# Patient Record
Sex: Male | Born: 1990
Health system: Southern US, Community
[De-identification: ages and names within clinical notes are randomized; demographics above are authoritative.]

## PROBLEM LIST (undated history)

## (undated) DIAGNOSIS — U071 COVID-19: Secondary | ICD-10-CM

## (undated) DIAGNOSIS — K37 Unspecified appendicitis: Secondary | ICD-10-CM

## (undated) HISTORY — PX: WISDOM TOOTH EXTRACTION: SHX21

## (undated) HISTORY — PX: TYMPANOSTOMY TUBE PLACEMENT: SHX32

## (undated) HISTORY — PX: APPENDECTOMY: SHX54

---

## 2012-04-30 ENCOUNTER — Emergency Department (HOSPITAL_COMMUNITY)
Admission: EM | Admit: 2012-04-30 | Discharge: 2012-04-30 | Disposition: A | Discharge status: Home / Self Care | Payer: BC Managed Care – PPO | Attending: Emergency Medicine | Admitting: Emergency Medicine

## 2012-04-30 ENCOUNTER — Encounter (HOSPITAL_COMMUNITY): Payer: Self-pay

## 2012-04-30 DIAGNOSIS — R197 Diarrhea, unspecified: Secondary | ICD-10-CM | POA: Insufficient documentation

## 2012-04-30 DIAGNOSIS — E860 Dehydration: Secondary | ICD-10-CM | POA: Insufficient documentation

## 2012-04-30 DIAGNOSIS — R5381 Other malaise: Secondary | ICD-10-CM | POA: Insufficient documentation

## 2012-04-30 DIAGNOSIS — R109 Unspecified abdominal pain: Secondary | ICD-10-CM | POA: Insufficient documentation

## 2012-04-30 DIAGNOSIS — R630 Anorexia: Secondary | ICD-10-CM | POA: Insufficient documentation

## 2012-04-30 LAB — CBC WITH DIFFERENTIAL/PLATELET
Basophils Absolute: 0 10*3/uL (ref 0.0–0.1)
Basophils Relative: 0 % (ref 0–1)
Eosinophils Absolute: 0.2 10*3/uL (ref 0.0–0.7)
MCH: 31.4 pg (ref 26.0–34.0)
MCHC: 36.4 g/dL — ABNORMAL HIGH (ref 30.0–36.0)
Neutrophils Relative %: 92 % — ABNORMAL HIGH (ref 43–77)
Platelets: 167 10*3/uL (ref 150–400)
RBC: 5.79 MIL/uL (ref 4.22–5.81)
RDW: 12.6 % (ref 11.5–15.5)

## 2012-04-30 LAB — COMPREHENSIVE METABOLIC PANEL
ALT: 30 U/L (ref 0–53)
Albumin: 4.6 g/dL (ref 3.5–5.2)
Alkaline Phosphatase: 129 U/L — ABNORMAL HIGH (ref 39–117)
Calcium: 9.6 mg/dL (ref 8.4–10.5)
Potassium: 4 mEq/L (ref 3.5–5.1)
Sodium: 136 mEq/L (ref 135–145)
Total Protein: 7.8 g/dL (ref 6.0–8.3)

## 2012-04-30 LAB — URINALYSIS, ROUTINE W REFLEX MICROSCOPIC
Bilirubin Urine: NEGATIVE
Hgb urine dipstick: NEGATIVE
Nitrite: NEGATIVE
Specific Gravity, Urine: 1.02 (ref 1.005–1.030)
pH: 5.5 (ref 5.0–8.0)

## 2012-04-30 MED ORDER — SODIUM CHLORIDE 0.9 % IV BOLUS (SEPSIS)
1000.0000 mL | Freq: Once | INTRAVENOUS | Status: AC
  Administered 2012-04-30: 1000 mL via INTRAVENOUS

## 2012-04-30 MED ORDER — ONDANSETRON HCL 4 MG/2ML IJ SOLN
4.0000 mg | Freq: Once | INTRAMUSCULAR | Status: AC
  Administered 2012-04-30: 4 mg via INTRAVENOUS
  Filled 2012-04-30: qty 2

## 2012-04-30 MED ORDER — ACETAMINOPHEN 325 MG PO TABS: 650.0000 mg | ORAL_TABLET | Freq: Once | ORAL | Status: DC

## 2012-04-30 MED ORDER — ONDANSETRON 8 MG PO TBDP: 8.0000 mg | ORAL_TABLET | Freq: Three times a day (TID) | ORAL | Status: DC | PRN

## 2012-04-30 MED ORDER — ACETAMINOPHEN 325 MG PO TABS
ORAL_TABLET | ORAL | Status: AC
  Administered 2012-04-30: 650 mg
  Filled 2012-04-30: qty 2

## 2012-04-30 MED ORDER — DIPHENOXYLATE-ATROPINE 2.5-0.025 MG PO TABS
1.0000 | ORAL_TABLET | Freq: Once | ORAL | Status: AC
  Administered 2012-04-30: 1 via ORAL
  Filled 2012-04-30: qty 1

## 2012-04-30 MED ORDER — PROMETHAZINE HCL 25 MG/ML IJ SOLN
12.5000 mg | Freq: Once | INTRAMUSCULAR | Status: AC
  Administered 2012-04-30: 12.5 mg via INTRAVENOUS
  Filled 2012-04-30: qty 1

## 2012-04-30 MED ORDER — ONDANSETRON 8 MG PO TBDP
8.0000 mg | ORAL_TABLET | Freq: Once | ORAL | Status: AC
  Administered 2012-04-30: 8 mg via ORAL
  Filled 2012-04-30: qty 1

## 2012-04-30 MED ORDER — PROMETHAZINE HCL 25 MG PO TABS: 25.0000 mg | ORAL_TABLET | Freq: Four times a day (QID) | ORAL | Status: DC | PRN

## 2012-04-30 NOTE — ED Provider Notes (Signed)
History     CSN: [id number]  Arrival date & time 04/30/12  3086   First MD Initiated Contact with Patient 04/30/12 [phone]      Chief Complaint  Patient presents with  . Emesis  . Diarrhea    (Consider location/radiation/quality/duration/timing/severity/associated sxs/prior treatment) HPI Anthony Robles is a 22 y.o. male who presents with complaint of abdominal pain, nausea, vomiting onset around 1 am. Pt states symptoms began with water diarrhea. States followed by vomiting. States vomited about 13 times. States no abdominal pain except for when vomiting. States he does not drink alcohol, no medical problems. No prior abdominal problems. No surgeries. 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 Not sure if any others are sick.   No past medical history on file.  Past Surgical History  Procedure Laterality Date  . Tympanostomy tube placement      No family history on file.  History  Substance Use Topics  . Smoking status: Never Smoker   . Smokeless tobacco: Not on file  . Alcohol Use: No      Review of Systems  Gastrointestinal: Positive for nausea, vomiting, abdominal pain and diarrhea.  Genitourinary: Negative for dysuria and flank pain.  Allergic/Immunologic: Negative for immunocompromised state.  Neurological: Positive for weakness.  All other systems reviewed and are negative.    Allergies  Review of patient's allergies indicates no known allergies.  Home Medications  No current outpatient prescriptions on file.  BP 127/59  Pulse 80  Temp(Src) 97.8 F (36.6 C) (Oral)  Resp 18  SpO2 99%  Physical Exam  Nursing note and vitals reviewed. Constitutional: He appears well-developed and well-nourished. No distress.  Eyes: Conjunctivae are normal.  Neck: Neck supple.  Cardiovascular: Normal rate, regular rhythm and normal heart sounds.   Pulmonary/Chest: Effort normal and breath sounds normal. No respiratory distress. He has no wheezes. He has no rales.  Abdominal:  Soft. Bowel sounds are normal. He exhibits no distension. There is no tenderness. There is no rebound.  Musculoskeletal: He exhibits no edema.  Skin: Skin is warm and dry.  Psychiatric: He has a normal mood and affect.    ED Course  Procedures (including critical care time)  Labs Reviewed  CBC WITH DIFFERENTIAL - Abnormal; Notable for the following:    WBC 15.2 (*)    Hemoglobin 18.2 (*)    MCHC 36.4 (*)    Neutrophils Relative 92 (*)    Neutro Abs 13.9 (*)    Lymphocytes Relative 3 (*)    Lymphs Abs 0.4 (*)    All other components within normal limits  COMPREHENSIVE METABOLIC PANEL - Abnormal; Notable for the following:    Glucose, Bld 126 (*)    BUN 26 (*)    Alkaline Phosphatase 129 (*)    All other components within normal limits  LIPASE, BLOOD - Abnormal; Notable for the following:    Lipase 61 (*)    All other components within normal limits  URINALYSIS, ROUTINE W REFLEX MICROSCOPIC   No results found.   1. Nausea vomiting and diarrhea   2. Dehydration       MDM  Pt with n/v/d onset few hrs ago. Here received IV fluids, zofran, feeling much better. Pt has no current abdominal pain or tenderness on exam. His lipase is slightly bumped to 61, discussed this with pt, instructed to return if pain worsening, could be early pancreatitis. Pt has no risk factors for pancreatitis, no family hx, does not drink alcohol, not on any medications. His  WBC elevated, most likely due to gastroenteritis. Abdomen non tender, no surgical abdomen. He ws given 3L of fluids for dehydration. He is drinking water with no nausea. Plan to d/c home with follow up.   Pt continued to have some nausea, just prior to d/c. Tried phenergan, which has helped. He again feeling better, drinking fluids. He did spike a fever up to 100, given tylenol for that. Abdomen reassessed. Non tender. Suspect viral gastroenteritis. Parents are by bedside. Will take pt home. Given careful precautions to return if  worsening.   Filed Vitals:   04/30/12 1158 04/30/12 1242 04/30/12 1246 04/30/12 1309  BP:  122/68  122/68  Pulse:  95  96  Temp: 100.4 F (38 C)  100.7 F (38.2 C)   TempSrc: Oral  Oral Oral  Resp:    18  SpO2:  100%  100%         Lottie Mussel, PA 04/30/12 1604  Lottie Mussel, PA 04/30/12 1604

## 2012-04-30 NOTE — ED Notes (Signed)
Per pt, felt gassy prior to bed last night.  States decreased appetite x 3-4 days.  N/V/D continuously since 1 am.  No fever noted at home.

## 2012-04-30 NOTE — ED Notes (Signed)
Voiced understanding of instructions given 

## 2012-04-30 NOTE — Discharge Instructions (Signed)
Make sure to drink plenty of fluids at home. Advance your diet slowly as tolerate. Zofran and/or phenergan for nausea as needed. Tylenol for fever every 4 hrs. Follow up with your doctor as needed. If develop abdominal pain, unable to keep any fluids down, symptoms worsening, return to ER.   Norovirus Infection Norovirus illness is caused by a viral infection. The term norovirus refers to a group of viruses. Any of those viruses can cause norovirus illness. This illness is often referred to by other names such as viral gastroenteritis, stomach flu, and food poisoning. Anyone can get a norovirus infection. People can have the illness multiple times during their lifetime. CAUSES  Norovirus is found in the stool or vomit of infected people. It is easily spread from person to person (contagious). People with norovirus are contagious from the moment they begin feeling ill. They may remain contagious for as long as 3 days to 2 weeks after recovery. People can become infected with the virus in several ways. This includes:  Eating food or drinking liquids that are contaminated with norovirus.  Touching surfaces or objects contaminated with norovirus, and then placing your hand in your mouth.  Having direct contact with a person who is infected and shows symptoms. This may occur while caring for someone with illness or while sharing foods or eating utensils with someone who is ill. SYMPTOMS  Symptoms usually begin 1 to 2 days after ingestion of the virus. Symptoms may include:  Nausea.  Vomiting.  Diarrhea.  Stomach cramps.  Low-grade fever.  Chills.  Headache.  Muscle aches.  Tiredness. Most people with norovirus illness get better within 1 to 2 days. Some people become dehydrated because they cannot drink enough liquids to replace those lost from vomiting and diarrhea. This is especially true for young children, the elderly, and others who are unable to care for themselves. DIAGNOSIS    Diagnosis is based on your symptoms and exam. Currently, only state public health laboratories have the ability to test for norovirus in stool or vomit. TREATMENT  No specific treatment exists for norovirus infections. No vaccine is available to prevent infections. Norovirus illness is usually brief in healthy people. If you are ill with vomiting and diarrhea, you should drink enough water and fluids to keep your urine clear or pale yellow. Dehydration is the most serious health effect that can result from this infection. By drinking oral rehydration solution (ORS), people can reduce their chance of becoming dehydrated. There are many commercially available pre-made and powdered ORS designed to safely rehydrate people. These may be recommended by your caregiver. Replace any new fluid losses from diarrhea or vomiting with ORS as follows:  If your child weighs 10 kg or less (22 lb or less), give 60 to 120 ml ( to  cup or 2 to 4 oz) of ORS for each diarrheal stool or vomiting episode.  If your child weighs more than 10 kg (more than 22 lb), give 120 to 240 ml ( to 1 cup or 4 to 8 oz) of ORS for each diarrheal stool or vomiting episode. HOME CARE INSTRUCTIONS   Follow all your caregiver's instructions.  Avoid sugar-free and alcoholic drinks while ill.  Only take over-the-counter or prescription medicines for pain, vomiting, diarrhea, or fever as directed by your caregiver. You can decrease your chances of coming in contact with norovirus or spreading it by following these steps:  Frequently wash your hands, especially after using the toilet, changing diapers, and before eating or  preparing food.  Carefully wash fruits and vegetables. Cook shellfish before eating them.  Do not prepare food for others while you are infected and for at least 3 days after recovering from illness.  Thoroughly clean and disinfect contaminated surfaces immediately after an episode of illness using a bleach-based  household cleaner.  Immediately remove and wash clothing or linens that may be contaminated with the virus.  Use the toilet to dispose of any vomit or stool. Make sure the surrounding area is kept clean.  Food that may have been contaminated by an ill person should be discarded. SEEK IMMEDIATE MEDICAL CARE IF:   You develop symptoms of dehydration that do not improve with fluid replacement. This may include:  Excessive sleepiness.  Lack of tears.  Dry mouth.  Dizziness when standing.  Weak pulse. Document Released: 05/22/2002 Document Revised: 05/24/2011 Document Reviewed: 06/23/2009 [facility] Patient Information 2013 [place], [place].  B.R.A.T. Diet Your doctor has recommended the B.R.A.T. diet for you or your child until the condition improves. This is often used to help control diarrhea and vomiting symptoms. If you or your child can tolerate clear liquids, you may have:  Bananas.   Rice.   Applesauce.   Toast (and other simple starches such as crackers, potatoes, noodles).  Be sure to avoid dairy products, meats, and fatty foods until symptoms are better. Fruit juices such as apple, grape, and prune juice can make diarrhea worse. Avoid these. Continue this diet for 2 days or as instructed by your caregiver. Document Released: 03/01/2005 Document Revised: 02/18/2011 Document Reviewed: 08/18/2006 [facility] Patient Information 2012 [place], [place].

## 2012-04-30 NOTE — ED Notes (Signed)
Pt states has not been feeling well x 2 days; generalized malaise and decreased appetite; N/V/D began this am around 1am; pt reports more than 10 episodes of vomiting and diarrhea; states abd is sore from vomiting and cramping; states pain and cramping come on with the vomiting and diarrhea; denies blood to vomit or emesis.

## 2012-05-03 NOTE — ED Provider Notes (Signed)
Medical screening examination/treatment/procedure(s) were performed by non-physician practitioner and as supervising physician I was immediately available for consultation/collaboration.  Kahealani Yankovich R. Blayke Cordrey, MD 05/03/12 1102 

## 2013-08-14 ENCOUNTER — Other Ambulatory Visit: Payer: Self-pay | Admitting: Family Medicine

## 2013-08-14 DIAGNOSIS — N6320 Unspecified lump in the left breast, unspecified quadrant: Secondary | ICD-10-CM

## 2013-08-20 ENCOUNTER — Other Ambulatory Visit: Payer: BC Managed Care – PPO

## 2013-08-22 ENCOUNTER — Other Ambulatory Visit: Payer: Self-pay | Admitting: Family Medicine

## 2013-08-22 ENCOUNTER — Ambulatory Visit
Admission: RE | Admit: 2013-08-22 | Discharge: 2013-08-22 | Disposition: A | Discharge status: Home / Self Care | Source: Ambulatory Visit | Attending: Family Medicine | Admitting: Family Medicine

## 2013-08-22 DIAGNOSIS — N6320 Unspecified lump in the left breast, unspecified quadrant: Secondary | ICD-10-CM

## 2013-08-22 DIAGNOSIS — N6310 Unspecified lump in the right breast, unspecified quadrant: Secondary | ICD-10-CM

## 2014-03-23 ENCOUNTER — Observation Stay (HOSPITAL_COMMUNITY): Admitting: Anesthesiology

## 2014-03-23 ENCOUNTER — Observation Stay (HOSPITAL_COMMUNITY)
Admission: EM | Admit: 2014-03-23 | Discharge: 2014-03-24 | Disposition: A | Discharge status: Home / Self Care | Attending: Surgery | Admitting: Surgery

## 2014-03-23 ENCOUNTER — Encounter (HOSPITAL_COMMUNITY)
Admission: EM | Disposition: A | Discharge status: Home / Self Care | Payer: Self-pay | Source: Home / Self Care | Attending: Emergency Medicine

## 2014-03-23 ENCOUNTER — Encounter (HOSPITAL_COMMUNITY): Payer: Self-pay | Admitting: *Deleted

## 2014-03-23 ENCOUNTER — Emergency Department (HOSPITAL_COMMUNITY)

## 2014-03-23 DIAGNOSIS — R1031 Right lower quadrant pain: Secondary | ICD-10-CM

## 2014-03-23 DIAGNOSIS — K37 Unspecified appendicitis: Secondary | ICD-10-CM | POA: Diagnosis present

## 2014-03-23 DIAGNOSIS — K3580 Unspecified acute appendicitis: Secondary | ICD-10-CM | POA: Diagnosis not present

## 2014-03-23 DIAGNOSIS — K3589 Other acute appendicitis without perforation or gangrene: Secondary | ICD-10-CM

## 2014-03-23 HISTORY — PX: LAPAROSCOPIC APPENDECTOMY: SHX408

## 2014-03-23 LAB — URINALYSIS, ROUTINE W REFLEX MICROSCOPIC
BILIRUBIN URINE: NEGATIVE
Glucose, UA: NEGATIVE mg/dL
Hgb urine dipstick: NEGATIVE
Ketones, ur: NEGATIVE mg/dL
LEUKOCYTES UA: NEGATIVE
Nitrite: NEGATIVE
Protein, ur: NEGATIVE mg/dL
SPECIFIC GRAVITY, URINE: 1.022 (ref 1.005–1.030)
UROBILINOGEN UA: 0.2 mg/dL (ref 0.0–1.0)
pH: 6.5 (ref 5.0–8.0)

## 2014-03-23 LAB — CBC WITH DIFFERENTIAL/PLATELET
BASOS ABS: 0.1 10*3/uL (ref 0.0–0.1)
BASOS PCT: 1 % (ref 0–1)
EOS ABS: 0.5 10*3/uL (ref 0.0–0.7)
Eosinophils Relative: 7 % — ABNORMAL HIGH (ref 0–5)
HEMATOCRIT: 44 % (ref 39.0–52.0)
Hemoglobin: 16 g/dL (ref 13.0–17.0)
Lymphocytes Relative: 19 % (ref 12–46)
Lymphs Abs: 1.3 10*3/uL (ref 0.7–4.0)
MCH: 30.3 pg (ref 26.0–34.0)
MCHC: 36.4 g/dL — AB (ref 30.0–36.0)
MCV: 83.3 fL (ref 78.0–100.0)
MONO ABS: 0.4 10*3/uL (ref 0.1–1.0)
MONOS PCT: 5 % (ref 3–12)
NEUTROS PCT: 68 % (ref 43–77)
Neutro Abs: 4.6 10*3/uL (ref 1.7–7.7)
PLATELETS: 191 10*3/uL (ref 150–400)
RBC: 5.28 MIL/uL (ref 4.22–5.81)
RDW: 12.4 % (ref 11.5–15.5)
WBC: 6.8 10*3/uL (ref 4.0–10.5)

## 2014-03-23 LAB — COMPREHENSIVE METABOLIC PANEL
ALBUMIN: 4.5 g/dL (ref 3.5–5.2)
ALT: 20 U/L (ref 0–53)
AST: 16 U/L (ref 0–37)
Alkaline Phosphatase: 80 U/L (ref 39–117)
Anion gap: 7 (ref 5–15)
BUN: 15 mg/dL (ref 6–23)
CO2: 25 mmol/L (ref 19–32)
CREATININE: 0.91 mg/dL (ref 0.50–1.35)
Calcium: 9.4 mg/dL (ref 8.4–10.5)
Chloride: 104 mEq/L (ref 96–112)
GFR calc non Af Amer: >90 mL/min (ref >90)
Glucose, Bld: 102 mg/dL — ABNORMAL HIGH (ref 70–99)
POTASSIUM: 3.8 mmol/L (ref 3.5–5.1)
SODIUM: 136 mmol/L (ref 135–145)
TOTAL PROTEIN: 6.9 g/dL (ref 6.0–8.3)
Total Bilirubin: 1.3 mg/dL — ABNORMAL HIGH (ref 0.3–1.2)

## 2014-03-23 SURGERY — APPENDECTOMY, LAPAROSCOPIC
Anesthesia: General

## 2014-03-23 MED ORDER — HYDROMORPHONE HCL 1 MG/ML IJ SOLN
0.5000 mg | INTRAMUSCULAR | Status: DC | PRN
  Administered 2014-03-23: 1 mg via INTRAVENOUS
  Filled 2014-03-23: qty 1

## 2014-03-23 MED ORDER — BUPIVACAINE-EPINEPHRINE (PF) 0.25% -1:200000 IJ SOLN
INTRAMUSCULAR | Status: AC
  Filled 2014-03-23: qty 30

## 2014-03-23 MED ORDER — ONDANSETRON HCL 4 MG/2ML IJ SOLN
INTRAMUSCULAR | Status: DC | PRN
  Administered 2014-03-23: 4 mg via INTRAVENOUS

## 2014-03-23 MED ORDER — DIPHENHYDRAMINE HCL 12.5 MG/5ML PO ELIX: 12.5000 mg | ORAL_SOLUTION | Freq: Four times a day (QID) | ORAL | Status: DC | PRN

## 2014-03-23 MED ORDER — 0.9 % SODIUM CHLORIDE (POUR BTL) OPTIME
TOPICAL | Status: DC | PRN
  Administered 2014-03-23: 1000 mL

## 2014-03-23 MED ORDER — VECURONIUM BROMIDE 10 MG IV SOLR
INTRAVENOUS | Status: DC | PRN
  Administered 2014-03-23: 2 mg via INTRAVENOUS
  Administered 2014-03-23: 3 mg via INTRAVENOUS

## 2014-03-23 MED ORDER — IOHEXOL 300 MG/ML  SOLN
25.0000 mL | Freq: Once | INTRAMUSCULAR | Status: AC | PRN
  Administered 2014-03-23: 25 mL via ORAL

## 2014-03-23 MED ORDER — HYDROCODONE-ACETAMINOPHEN 5-325 MG PO TABS
1.0000 | ORAL_TABLET | ORAL | Status: DC | PRN
  Administered 2014-03-23 – 2014-03-24 (×2): 2 via ORAL
  Filled 2014-03-23 (×2): qty 2

## 2014-03-23 MED ORDER — LIDOCAINE HCL (CARDIAC) 20 MG/ML IV SOLN
INTRAVENOUS | Status: DC | PRN
  Administered 2014-03-23: 50 mg via INTRAVENOUS

## 2014-03-23 MED ORDER — SUCCINYLCHOLINE CHLORIDE 20 MG/ML IJ SOLN
INTRAMUSCULAR | Status: DC | PRN
  Administered 2014-03-23: 100 mg via INTRAVENOUS

## 2014-03-23 MED ORDER — ONDANSETRON HCL 4 MG/2ML IJ SOLN: 4.0000 mg | Freq: Four times a day (QID) | INTRAMUSCULAR | Status: DC | PRN

## 2014-03-23 MED ORDER — SUCCINYLCHOLINE CHLORIDE 20 MG/ML IJ SOLN
INTRAMUSCULAR | Status: AC
  Filled 2014-03-23: qty 1

## 2014-03-23 MED ORDER — GLYCOPYRROLATE 0.2 MG/ML IJ SOLN
INTRAMUSCULAR | Status: AC
  Filled 2014-03-23: qty 2

## 2014-03-23 MED ORDER — MORPHINE SULFATE 4 MG/ML IJ SOLN
4.0000 mg | Freq: Once | INTRAMUSCULAR | Status: AC
  Administered 2014-03-23: 4 mg via INTRAVENOUS
  Filled 2014-03-23: qty 1

## 2014-03-23 MED ORDER — VECURONIUM BROMIDE 10 MG IV SOLR
INTRAVENOUS | Status: AC
  Filled 2014-03-23: qty 10

## 2014-03-23 MED ORDER — SODIUM CHLORIDE 0.9 % IV BOLUS (SEPSIS)
1000.0000 mL | Freq: Once | INTRAVENOUS | Status: AC
  Administered 2014-03-23: 1000 mL via INTRAVENOUS

## 2014-03-23 MED ORDER — FENTANYL CITRATE 0.05 MG/ML IJ SOLN
INTRAMUSCULAR | Status: DC | PRN
  Administered 2014-03-23: 150 ug via INTRAVENOUS
  Administered 2014-03-23: 100 ug via INTRAVENOUS

## 2014-03-23 MED ORDER — ONDANSETRON HCL 4 MG/2ML IJ SOLN
INTRAMUSCULAR | Status: AC
  Filled 2014-03-23: qty 2

## 2014-03-23 MED ORDER — DEXAMETHASONE SODIUM PHOSPHATE 4 MG/ML IJ SOLN
INTRAMUSCULAR | Status: DC | PRN
  Administered 2014-03-23: 4 mg via INTRAVENOUS

## 2014-03-23 MED ORDER — IOHEXOL 300 MG/ML  SOLN
100.0000 mL | Freq: Once | INTRAMUSCULAR | Status: AC | PRN
  Administered 2014-03-23: 100 mL via INTRAVENOUS

## 2014-03-23 MED ORDER — SODIUM CHLORIDE 0.9 % IJ SOLN
INTRAMUSCULAR | Status: AC
  Filled 2014-03-23: qty 10

## 2014-03-23 MED ORDER — BUPIVACAINE-EPINEPHRINE 0.25% -1:200000 IJ SOLN
INTRAMUSCULAR | Status: DC | PRN
  Administered 2014-03-23: 20 mL

## 2014-03-23 MED ORDER — GLYCOPYRROLATE 0.2 MG/ML IJ SOLN
INTRAMUSCULAR | Status: DC | PRN
  Administered 2014-03-23: 0.4 mg via INTRAVENOUS

## 2014-03-23 MED ORDER — ONDANSETRON HCL 4 MG/2ML IJ SOLN: 4.0000 mg | Freq: Once | INTRAMUSCULAR | Status: DC | PRN

## 2014-03-23 MED ORDER — DIPHENHYDRAMINE HCL 50 MG/ML IJ SOLN: 12.5000 mg | Freq: Four times a day (QID) | INTRAMUSCULAR | Status: DC | PRN

## 2014-03-23 MED ORDER — DEXTROSE 5 % IV SOLN
2.0000 g | Freq: Once | INTRAVENOUS | Status: AC
  Administered 2014-03-23: 2 g via INTRAVENOUS
  Filled 2014-03-23: qty 2

## 2014-03-23 MED ORDER — PROPOFOL 10 MG/ML IV BOLUS
INTRAVENOUS | Status: AC
  Filled 2014-03-23: qty 20

## 2014-03-23 MED ORDER — DEXTROSE 5 % IV SOLN
2.0000 g | INTRAVENOUS | Status: DC
  Filled 2014-03-23: qty 2

## 2014-03-23 MED ORDER — ONDANSETRON HCL 4 MG/2ML IJ SOLN
4.0000 mg | Freq: Once | INTRAMUSCULAR | Status: AC
  Administered 2014-03-23: 4 mg via INTRAVENOUS
  Filled 2014-03-23: qty 2

## 2014-03-23 MED ORDER — NEOSTIGMINE METHYLSULFATE 10 MG/10ML IV SOLN
INTRAVENOUS | Status: DC | PRN
  Administered 2014-03-23: 2 mg via INTRAVENOUS

## 2014-03-23 MED ORDER — LIDOCAINE HCL (CARDIAC) 20 MG/ML IV SOLN
INTRAVENOUS | Status: AC
  Filled 2014-03-23: qty 5

## 2014-03-23 MED ORDER — PROPOFOL 10 MG/ML IV BOLUS
INTRAVENOUS | Status: DC | PRN
  Administered 2014-03-23: 200 mg via INTRAVENOUS

## 2014-03-23 MED ORDER — POTASSIUM CHLORIDE IN NACL 20-0.9 MEQ/L-% IV SOLN
INTRAVENOUS | Status: DC
Start: 2014-03-23 — End: 2014-03-24
  Administered 2014-03-23 – 2014-03-24 (×2): via INTRAVENOUS
  Filled 2014-03-23 (×4): qty 1000

## 2014-03-23 MED ORDER — LACTATED RINGERS IV SOLN
INTRAVENOUS | Status: DC | PRN
  Administered 2014-03-23 (×2): via INTRAVENOUS

## 2014-03-23 MED ORDER — HYDROMORPHONE HCL 1 MG/ML IJ SOLN
1.0000 mg | Freq: Once | INTRAMUSCULAR | Status: AC
  Administered 2014-03-23: 1 mg via INTRAVENOUS
  Filled 2014-03-23: qty 1

## 2014-03-23 MED ORDER — HYDROMORPHONE HCL 1 MG/ML IJ SOLN: 0.2500 mg | INTRAMUSCULAR | Status: DC | PRN

## 2014-03-23 MED ORDER — SODIUM CHLORIDE 0.9 % IR SOLN
Status: DC | PRN
  Administered 2014-03-23: 1000 mL

## 2014-03-23 MED ORDER — NEOSTIGMINE METHYLSULFATE 10 MG/10ML IV SOLN
INTRAVENOUS | Status: AC
  Filled 2014-03-23: qty 1

## 2014-03-23 MED ORDER — FENTANYL CITRATE 0.05 MG/ML IJ SOLN
INTRAMUSCULAR | Status: AC
  Filled 2014-03-23: qty 5

## 2014-03-23 SURGICAL SUPPLY — 42 items
APPLIER CLIP ROT 10 11.4 M/L (STAPLE)
BENZOIN TINCTURE PRP APPL 2/3 (GAUZE/BANDAGES/DRESSINGS) ×3 IMPLANT
BLADE SURG ROTATE 9660 (MISCELLANEOUS) IMPLANT
CANISTER SUCTION 2500CC (MISCELLANEOUS) ×3 IMPLANT
CHLORAPREP W/TINT 26ML (MISCELLANEOUS) ×3 IMPLANT
CLIP APPLIE ROT 10 11.4 M/L (STAPLE) IMPLANT
CLOSURE STERI-STRIP 1/2X4 (GAUZE/BANDAGES/DRESSINGS) ×1
CLSR STERI-STRIP ANTIMIC 1/2X4 (GAUZE/BANDAGES/DRESSINGS) ×2 IMPLANT
COVER SURGICAL LIGHT HANDLE (MISCELLANEOUS) ×3 IMPLANT
CUTTER FLEX LINEAR 45M (STAPLE) ×3 IMPLANT
DRAPE LAPAROSCOPIC ABDOMINAL (DRAPES) ×3 IMPLANT
DRAPE UTILITY XL STRL (DRAPES) ×6 IMPLANT
DRSG TEGADERM 2-3/8X2-3/4 SM (GAUZE/BANDAGES/DRESSINGS) ×6 IMPLANT
DRSG TEGADERM 4X4.75 (GAUZE/BANDAGES/DRESSINGS) ×3 IMPLANT
ELECT REM PT RETURN 9FT ADLT (ELECTROSURGICAL) ×3
ELECTRODE REM PT RTRN 9FT ADLT (ELECTROSURGICAL) ×1 IMPLANT
ENDOLOOP SUT PDS II  0 18 (SUTURE)
ENDOLOOP SUT PDS II 0 18 (SUTURE) IMPLANT
FILTER SMOKE EVAC LAPAROSHD (FILTER) ×3 IMPLANT
GAUZE SPONGE 2X2 8PLY STRL LF (GAUZE/BANDAGES/DRESSINGS) ×1 IMPLANT
GLOVE BIO SURGEON STRL SZ7 (GLOVE) ×3 IMPLANT
GLOVE BIOGEL PI IND STRL 7.5 (GLOVE) ×1 IMPLANT
GLOVE BIOGEL PI INDICATOR 7.5 (GLOVE) ×2
GOWN STRL REUS W/ TWL LRG LVL3 (GOWN DISPOSABLE) ×3 IMPLANT
GOWN STRL REUS W/TWL LRG LVL3 (GOWN DISPOSABLE) ×6
KIT BASIN OR (CUSTOM PROCEDURE TRAY) ×3 IMPLANT
KIT ROOM TURNOVER OR (KITS) ×3 IMPLANT
NS IRRIG 1000ML POUR BTL (IV SOLUTION) ×3 IMPLANT
PAD ARMBOARD 7.5X6 YLW CONV (MISCELLANEOUS) ×6 IMPLANT
POUCH SPECIMEN RETRIEVAL 10MM (ENDOMECHANICALS) ×3 IMPLANT
RELOAD STAPLE TA45 3.5 REG BLU (ENDOMECHANICALS) ×3 IMPLANT
SCALPEL HARMONIC ACE (MISCELLANEOUS) ×3 IMPLANT
SET IRRIG TUBING LAPAROSCOPIC (IRRIGATION / IRRIGATOR) ×3 IMPLANT
SPECIMEN JAR SMALL (MISCELLANEOUS) ×3 IMPLANT
SPONGE GAUZE 2X2 STER 10/PKG (GAUZE/BANDAGES/DRESSINGS) ×2
SUT MNCRL AB 4-0 PS2 18 (SUTURE) ×3 IMPLANT
TOWEL OR 17X24 6PK STRL BLUE (TOWEL DISPOSABLE) ×3 IMPLANT
TOWEL OR 17X26 10 PK STRL BLUE (TOWEL DISPOSABLE) ×3 IMPLANT
TRAY LAPAROSCOPIC (CUSTOM PROCEDURE TRAY) ×3 IMPLANT
TROCAR XCEL BLADELESS 5X75MML (TROCAR) ×6 IMPLANT
TROCAR XCEL BLUNT TIP 100MML (ENDOMECHANICALS) ×3 IMPLANT
TUBING INSUFFLATION (TUBING) ×3 IMPLANT

## 2014-03-23 NOTE — Anesthesia Procedure Notes (Signed)
Procedure Name: Intubation Date/Time: 03/23/2014 5:17 PM Performed by: Alanda AmassFRIEDMAN, Lashon Beringer A Pre-anesthesia Checklist: Patient identified, Timeout performed, Emergency Drugs available, Suction available and Patient being monitored Patient Re-evaluated:Patient Re-evaluated prior to inductionOxygen Delivery Method: Circle system utilized Preoxygenation: Pre-oxygenation with 100% oxygen Intubation Type: IV induction Laryngoscope Size: Mac and 3 Grade View: Grade I Tube type: Oral Tube size: 7.5 mm Number of attempts: 1 Airway Equipment and Method: Stylet Placement Confirmation: ETT inserted through vocal cords under direct vision,  breath sounds checked- equal and bilateral and positive ETCO2 Secured at: 21 cm Tube secured with: Tape Dental Injury: Teeth and Oropharynx as per pre-operative assessment

## 2014-03-23 NOTE — ED Notes (Signed)
Pt knows that urine is needed 

## 2014-03-23 NOTE — Anesthesia Postprocedure Evaluation (Signed)
  Anesthesia Post-op Note  Patient: Anthony Robles  Procedure(s) Performed: Procedure(s): APPENDECTOMY LAPAROSCOPIC (N/A)  Patient Location: PACU  Anesthesia Type:General  Level of Consciousness: awake, alert , oriented and patient cooperative  Airway and Oxygen Therapy: Patient Spontanous Breathing  Post-op Pain: mild  Post-op Assessment: Post-op Vital signs reviewed, Patient's Cardiovascular Status Stable, Respiratory Function Stable, Patent Airway, No signs of Nausea or vomiting and Pain level controlled  Post-op Vital Signs: stable  Last Vitals:  Filed Vitals:   03/23/14 1615  BP: 111/74  Pulse: 85  Temp:   Resp: 14    Complications: No apparent anesthesia complications

## 2014-03-23 NOTE — ED Notes (Signed)
PA at bedside.

## 2014-03-23 NOTE — ED Notes (Signed)
Pt reports onset this am of severe RLQ pain and nausea/diarrhea.

## 2014-03-23 NOTE — H&P (Signed)
[facility] Admission Note  Anthony Robles 1990/05/15  [id number].    Requesting MD: Dr. Colin Rhein Chief Complaint/Reason for Consult: Acute appendicitis  HPI:  24 y/o white male with no significant PMH presenting to the ED for abdominal pain.  Patient states sudden onset of 8/10 RLQ and right lateral abdominal pain associated with nausea and diarrhea this morning around 9:30am. He denies any vomiting. No melena or hematochezia.  No prior abdominal surgeries or abdominal problems.  +feverish/chills.  No radiating pain, no precipitating or alleviating factors.  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  He is also an avid exerciser.    ROS: All systems reviewed and otherwise negative except for as above  History reviewed. No pertinent family history.  History reviewed. No pertinent past medical history.  Past Surgical History  Procedure Laterality Date  . Tympanostomy tube placement      [age] y/o   . Wisdom tooth extraction      24 y/o    Social History:  reports that he has never smoked. He does not have any smokeless tobacco history on file. He reports that he drinks alcohol. He reports that he does not use illicit drugs.  Allergies: No Known Allergies   (Not in a hospital admission)  Blood pressure 134/79, pulse 80, temperature 98.1 F (36.7 C), temperature source Oral, resp. rate 13, SpO2 98 %. Physical Exam: General: pleasant, WD/WN white male who is laying in bed in NAD HEENT: head is normocephalic, atraumatic.  Sclera are noninjected.  PERRL.  Ears and nose without any masses or lesions.  Mouth is pink and moist Heart: regular, rate, and rhythm.  No obvious murmurs, gallops, or rubs noted.  Palpable pedal pulses bilaterally Lungs: CTAB, no wheezes, rhonchi, or rales noted.  Respiratory effort nonlabored Abd: soft, tender in RLQ, +pain at McBurney's point, +rovsing sign, ND, +BS, no masses, hernias, or organomegaly, no scars MS: all 4 extremities are  symmetrical with no cyanosis, clubbing, or edema. Skin: warm and dry with no masses, lesions, or rashes Psych: A&Ox3 with an appropriate affect.   Results for orders placed or performed during the hospital encounter of 03/23/14 (from the past 48 hour(s))  CBC with Differential     Status: Abnormal   Collection Time: 03/23/14 12:36 PM  Result Value Ref Range   WBC 6.8 4.0 - 10.5 K/uL   RBC 5.28 4.22 - 5.81 MIL/uL   Hemoglobin 16.0 13.0 - 17.0 g/dL   HCT 44.0 39.0 - 52.0 %   MCV 83.3 78.0 - 100.0 fL   MCH 30.3 26.0 - 34.0 pg   MCHC 36.4 (H) 30.0 - 36.0 g/dL   RDW 12.4 11.5 - 15.5 %   Platelets 191 150 - 400 K/uL   Neutrophils Relative % 68 43 - 77 %   Neutro Abs 4.6 1.7 - 7.7 K/uL   Lymphocytes Relative 19 12 - 46 %   Lymphs Abs 1.3 0.7 - 4.0 K/uL   Monocytes Relative 5 3 - 12 %   Monocytes Absolute 0.4 0.1 - 1.0 K/uL   Eosinophils Relative 7 (H) 0 - 5 %   Eosinophils Absolute 0.5 0.0 - 0.7 K/uL   Basophils Relative 1 0 - 1 %   Basophils Absolute 0.1 0.0 - 0.1 K/uL  Comprehensive metabolic panel     Status: Abnormal   Collection Time: 03/23/14 12:36 PM  Result Value Ref Range   Sodium 136 135 - 145 mmol/L    Comment: Please note  change in reference range.   Potassium 3.8 3.5 - 5.1 mmol/L    Comment: Please note change in reference range.   Chloride 104 96 - 112 mEq/L   CO2 25 19 - 32 mmol/L   Glucose, Bld 102 (H) 70 - 99 mg/dL   BUN 15 6 - 23 mg/dL   Creatinine, Ser 0.91 0.50 - 1.35 mg/dL   Calcium 9.4 8.4 - 10.5 mg/dL   Total Protein 6.9 6.0 - 8.3 g/dL   Albumin 4.5 3.5 - 5.2 g/dL   AST 16 0 - 37 U/L   ALT 20 0 - 53 U/L   Alkaline Phosphatase 80 39 - 117 U/L   Total Bilirubin 1.3 (H) 0.3 - 1.2 mg/dL   GFR calc non Af Amer >90 >90 mL/min   GFR calc Af Amer >90 >90 mL/min    Comment: (NOTE) The eGFR has been calculated using the CKD EPI equation. This calculation has not been validated in all clinical situations. eGFR's persistently <90 mL/min signify possible Chronic  Kidney Disease.    Anion gap 7 5 - 15  Urinalysis, Routine w reflex microscopic     Status: Abnormal   Collection Time: 03/23/14  1:44 PM  Result Value Ref Range   Color, Urine YELLOW YELLOW   APPearance CLOUDY (A) CLEAR   Specific Gravity, Urine 1.022 1.005 - 1.030   pH 6.5 5.0 - 8.0   Glucose, UA NEGATIVE NEGATIVE mg/dL   Hgb urine dipstick NEGATIVE NEGATIVE   Bilirubin Urine NEGATIVE NEGATIVE   Ketones, ur NEGATIVE NEGATIVE mg/dL   Protein, ur NEGATIVE NEGATIVE mg/dL   Urobilinogen, UA 0.2 0.0 - 1.0 mg/dL   Nitrite NEGATIVE NEGATIVE   Leukocytes, UA NEGATIVE NEGATIVE    Comment: MICROSCOPIC NOT DONE ON URINES WITH NEGATIVE PROTEIN, BLOOD, LEUKOCYTES, NITRITE, OR GLUCOSE <1000 mg/dL.   Ct Abdomen Pelvis W Contrast  03/23/2014   CLINICAL DATA:  6 hour history of the right lower quadrant pain. Nausea and diarrhea  EXAM: CT ABDOMEN AND PELVIS WITH CONTRAST  TECHNIQUE: Multidetector CT imaging of the abdomen and pelvis was performed using the standard protocol following bolus administration of intravenous contrast. Oral contrast was also administered.  CONTRAST:  152mL OMNIPAQUE IOHEXOL 300 MG/ML  SOLN  COMPARISON:  None.  FINDINGS: Lung bases are clear.  There are no focal liver lesions. Gallbladder wall is not thickened. There is no appreciable biliary duct dilatation.  Spleen, pancreas, and adrenals appear normal. Kidneys bilaterally show no appreciable mass or hydronephrosis on either side. There is no intrarenal or ureteral calculus on either side.  In the pelvis, the urinary bladder is midline with normal wall thickness. There is no pelvic mass or fluid collection.  There are several appendicoliths within the appendix. The appendix is mildly distended with subtle surrounding mesenteric thickening consistent with early acute appendicitis. No abscess seen in this region.  There is no bowel obstruction. No free air or portal venous air. The colon is largely decompressed. There are scattered  colonic diverticula without diverticulitis.  There is no ascites, adenopathy, or abscess in the abdomen or pelvis. Aorta appears unremarkable. There are no blastic or lytic bone lesions.  IMPRESSION: Evidence of acute appendiceal inflammation.  No abscess. No bowel obstruction. Scattered colonic diverticula without diverticulitis. No renal or ureteral calculus. No hydronephrosis.  Critical Value/emergent results were called by telephone at the time of interpretation on 03/23/2014 at 3:36 pm to [facility], PA, who verbally acknowledged these results.   Electronically Signed  By: Lowella Grip M.D.   On: 03/23/2014 15:36      Assessment/Plan Acute appendictitis  Plan: 1.  Admit to [facility], urgent OR for lap appy 2.  NPO, bowel rest, IVF, pain control, antiemetics, antibiotics (Cefoxitin) 3.  SCD's  4.  Discussed risks and benefits of the procedure including bleeding, infection, injury to intra-abdominal structures, conversion to open, intra-abdominal abscess, and stump leak/need for drain.  Discussed peri-operative course and post-op instructions.  He and his significant other agree to proceed with surgery.  Dr. Georgette Dover to see and evaluate.    Coralie Keens, [facility] 03/23/2014, 4:15 PM Pager: [phone]

## 2014-03-23 NOTE — ED Notes (Signed)
General surgery at bedside. 

## 2014-03-23 NOTE — Anesthesia Preprocedure Evaluation (Addendum)
Anesthesia Evaluation  Patient identified by MRN, date of birth, ID band Patient awake    Reviewed: Allergy & Precautions, NPO status , Patient's Chart, lab work & pertinent test results  Airway Mallampati: I  TM Distance: >3 FB Neck ROM: Full    Dental  (+) Teeth Intact, Dental Advisory Given   Pulmonary          Cardiovascular     Neuro/Psych    GI/Hepatic appendicitis   Endo/Other    Renal/GU      Musculoskeletal   Abdominal   Peds  Hematology   Anesthesia Other Findings   Reproductive/Obstetrics                            Anesthesia Physical Anesthesia Plan  ASA: I  Anesthesia Plan: General   Post-op Pain Management:    Induction: Intravenous  Airway Management Planned: Oral ETT  Additional Equipment:   Intra-op Plan:   Post-operative Plan: Extubation in OR  Informed Consent: I have reviewed the patients History and Physical, chart, labs and discussed the procedure including the risks, benefits and alternatives for the proposed anesthesia with the patient or authorized representative who has indicated his/her understanding and acceptance.   Dental advisory given  Plan Discussed with: CRNA, Anesthesiologist and Surgeon  Anesthesia Plan Comments:        Anesthesia Quick Evaluation

## 2014-03-23 NOTE — ED Provider Notes (Signed)
CSN: [id number]     Arrival date & time 03/23/14  1215 History   First MD Initiated Contact with Patient 03/23/14 1221     Chief Complaint  Patient presents with  . Abdominal Pain  . Nausea     (Consider location/radiation/quality/duration/timing/severity/associated sxs/prior Treatment) Patient is a 24 y.o. male presenting with abdominal pain. The history is provided by the patient and medical records.  Abdominal Pain Associated symptoms: diarrhea     This is a 24 year old male with no significant past medical history presenting to the ED for abdominal pain. Patient states sudden onset of right lower quadrant and right lateral abdominal pain associated with nausea and diarrhea this morning. He denies any vomiting. No melena or hematochezia. No recent travel or dietary changes. No abnormal food intake.   No prior abdominal surgeries. No fever or chills. No urinary symptoms. Vital signs stable on arrival.  History reviewed. No pertinent past medical history. Past Surgical History  Procedure Laterality Date  . Tympanostomy tube placement     History reviewed. No pertinent family history. History  Substance Use Topics  . Smoking status: Never Smoker   . Smokeless tobacco: Not on file  . Alcohol Use: Yes     Comment: occ    Review of Systems  Gastrointestinal: Positive for abdominal pain and diarrhea.  All other systems reviewed and are negative.     Allergies  Review of patient's allergies indicates no known allergies.  Home Medications   Prior to Admission medications   Medication Sig Start Date End Date Taking? Authorizing Provider  ondansetron (ZOFRAN ODT) 8 MG disintegrating tablet Take 1 tablet (8 mg total) by mouth every 8 (eight) hours as needed for nausea. 04/30/12   Tatyana A Kirichenko, PA-C  promethazine (PHENERGAN) 25 MG tablet Take 1 tablet (25 mg total) by mouth every 6 (six) hours as needed for nausea. 04/30/12   Tatyana A Kirichenko, PA-C   BP 155/85 mmHg   Pulse 72  Temp(Src) 98.1 F (36.7 C) (Oral)  Resp 18  SpO2 98%   Physical Exam  Constitutional: He is oriented to person, place, and time. He appears well-developed and well-nourished.  HENT:  Head: Normocephalic and atraumatic.  Mouth/Throat: Oropharynx is clear and moist.  Eyes: Conjunctivae and EOM are normal. Pupils are equal, round, and reactive to light.  Neck: Normal range of motion.  Cardiovascular: Normal rate, regular rhythm and normal heart sounds.   Pulmonary/Chest: Effort normal and breath sounds normal. No respiratory distress. He has no wheezes.  Abdominal: Soft. Bowel sounds are normal. There is tenderness in the right lower quadrant. There is no tenderness at McBurney's point.  Abdomen soft, non-distended, tenderness in right lower and lateral abdomen; negative McBurney's point tenderness, negative heel tap, negative Rovsing's sign  Musculoskeletal: Normal range of motion.  Neurological: He is alert and oriented to person, place, and time.  Skin: Skin is warm and dry.  Psychiatric: He has a normal mood and affect.  Nursing note and vitals reviewed.   ED Course  Procedures (including critical care time) Labs Review Labs Reviewed  CBC WITH DIFFERENTIAL - Abnormal; Notable for the following:    MCHC 36.4 (*)    Eosinophils Relative 7 (*)    All other components within normal limits  COMPREHENSIVE METABOLIC PANEL - Abnormal; Notable for the following:    Glucose, Bld 102 (*)    Total Bilirubin 1.3 (*)    All other components within normal limits  URINALYSIS, ROUTINE W REFLEX MICROSCOPIC -  Abnormal; Notable for the following:    APPearance CLOUDY (*)    All other components within normal limits    Imaging Review Ct Abdomen Pelvis W Contrast  03/23/2014   CLINICAL DATA:  6 hour history of the right lower quadrant pain. Nausea and diarrhea  EXAM: CT ABDOMEN AND PELVIS WITH CONTRAST  TECHNIQUE: Multidetector CT imaging of the abdomen and pelvis was performed using  the standard protocol following bolus administration of intravenous contrast. Oral contrast was also administered.  CONTRAST:  100mL OMNIPAQUE IOHEXOL 300 MG/ML  SOLN  COMPARISON:  None.  FINDINGS: Lung bases are clear.  There are no focal liver lesions. Gallbladder wall is not thickened. There is no appreciable biliary duct dilatation.  Spleen, pancreas, and adrenals appear normal. Kidneys bilaterally show no appreciable mass or hydronephrosis on either side. There is no intrarenal or ureteral calculus on either side.  In the pelvis, the urinary bladder is midline with normal wall thickness. There is no pelvic mass or fluid collection.  There are several appendicoliths within the appendix. The appendix is mildly distended with subtle surrounding mesenteric thickening consistent with early acute appendicitis. No abscess seen in this region.  There is no bowel obstruction. No free air or portal venous air. The colon is largely decompressed. There are scattered colonic diverticula without diverticulitis.  There is no ascites, adenopathy, or abscess in the abdomen or pelvis. Aorta appears unremarkable. There are no blastic or lytic bone lesions.  IMPRESSION: Evidence of acute appendiceal inflammation.  No abscess. No bowel obstruction. Scattered colonic diverticula without diverticulitis. No renal or ureteral calculus. No hydronephrosis.  Critical Value/emergent results were called by telephone at the time of interpretation on 03/23/2014 at 3:36 pm to [facility] Rapheal Masso, PA, who verbally acknowledged these results.   Electronically Signed   By: Bretta BangWilliam  Woodruff M.D.   On: 03/23/2014 15:36     EKG Interpretation None      MDM   Final diagnoses:  RLQ abdominal pain  Other acute appendicitis   24 year old male with sudden onset of right lower quadrant pain and diarrhea this morning.   No melena or hematochezia. No fever or chills. On exam, patient afebrile and nontoxic in appearance. He does have noted tenderness  in his right lower quadrant and right lateral abdomen.  Negative McBurney's point tenderness, negative heel-tap, negative Rovsing sign.   Not classic presentation for appendicitis, but on differential. Will obtain basic labs, patient given pain medication, antiemetics, and IV fluids. Will monitor for improvement.  1:56 PM After fluids and initial meds no improvement.  Repeat exam with increased tenderness.  Will obtain CT for further evaluation.  CT revealing acute appendicitis.  Case discussed with general surgery, PA Dort, will evaluate in ED and admit for surgery.  Garlon HatchetLisa M Aislynn Cifelli, PA-C 03/23/14 1553  Mirian MoMatthew Gentry, MD 03/26/14 [phone]

## 2014-03-23 NOTE — Transfer of Care (Signed)
Immediate Anesthesia Transfer of Care Note  Patient: Anthony GulaDavid Robles  Procedure(s) Performed: Procedure(s): APPENDECTOMY LAPAROSCOPIC (N/A)  Patient Location: PACU  Anesthesia Type:General  Level of Consciousness: awake  Airway & Oxygen Therapy: Patient Spontanous Breathing  Post-op Assessment: Report given to PACU RN and Post -op Vital signs reviewed and stable  Post vital signs: Reviewed  Complications: No apparent anesthesia complications

## 2014-03-23 NOTE — Op Note (Signed)
Appendectomy, Lap, Procedure Note  Indications: The patient presented with a history of right-sided abdominal pain. A CT scan revealed findings consistent with acute appendicitis.  Pre-operative Diagnosis: Acute appendicitis without mention of peritonitis  Post-operative Diagnosis: Same  Surgeon: Mariangela Heldt K.   Assistants: none  Anesthesia: General endotracheal anesthesia  ASA Class: 1 (Emergent)   Procedure Details  The patient was seen again in the Holding Room. The risks, benefits, complications, treatment options, and expected outcomes were discussed with the patient and/or family. The possibilities of reaction to medication, perforation of viscus, bleeding, recurrent infection, finding a normal appendix, the need for additional procedures, failure to diagnose a condition, and creating a complication requiring transfusion or operation were discussed. There was concurrence with the proposed plan and informed consent was obtained. The site of surgery was properly noted. The patient was taken to Operating Room, identified as Anthony Robles and the procedure verified as Appendectomy. A Time Out was held and the above information confirmed.  The patient was placed in the supine position and general anesthesia was induced.  The abdomen was prepped and draped in a sterile fashion. A one centimeter supraumbilical incision was made.  Dissection was carried down to the fascia bluntly.  The fascia was incised vertically.  We entered the peritoneal cavity bluntly.  A pursestring suture was passed around the incision with a 0 Vicryl.  The Hasson cannula was introduced into the abdomen and the tails of the suture were used to hold the Hasson in place.   The pneumoperitoneum was then established maintaining a maximum pressure of 15 mmHg.  Additional 5 mm cannulas then placed in the left lower quadrant of the abdomen and the right upper quadrant under direct visualization. A careful evaluation of the  entire abdomen was carried out. The patient was placed in Trendelenburg and left lateral decubitus position.  The scope was moved to the right upper quadrant port site. The cecum was mobilized medially.  The appendix was thickened and inflamed, but there was no sign of perforation.  The appendix was carefully dissected. The appendix was was skeletonized with the harmonic scalpel.   The appendix was divided at its base using an endo-GIA stapler. Minimal appendiceal stump was left in place. There was no evidence of bleeding, leakage, or complication after division of the appendix. Irrigation was also performed and irrigate suctioned from the abdomen as well.  The umbilical port site was closed with the purse string suture. There was no residual palpable fascial defect.  The trocar site skin wounds were closed with 4-0 Monocryl.  Instrument, sponge, and needle counts were correct at the conclusion of the case.   Findings: The appendix was found to be inflamed. There were not signs of necrosis.  There was not perforation. There was not abscess formation.  Estimated Blood Loss:  Minimal         Drains: none         Specimens: Appendix         Complications:  None; patient tolerated the procedure well.         Disposition: PACU - hemodynamically stable.         Condition: stable   Anthony ArmsMatthew K. Corliss Skainssuei, MD, [facility]  General/ Trauma Surgery  03/23/2014 5:47 PM

## 2014-03-23 NOTE — ED Notes (Addendum)
CT notified that pt finished contrast ?

## 2014-03-23 NOTE — ED Notes (Signed)
Pt from home for eval of right sided flank pain that started at 0900 today while in bed, tenderness noted to right upper and lower quadrants of flank. Pt reports nausea and diarrhea x3 times today, denies any vomiting or other urinary symptoms. NAD. axox 4.

## 2014-03-24 MED ORDER — HYDROCODONE-ACETAMINOPHEN 5-325 MG PO TABS: 1.0000 | ORAL_TABLET | Freq: Four times a day (QID) | ORAL | Status: DC | PRN

## 2014-03-24 NOTE — Discharge Summary (Signed)
[facility] Discharge Summary   Patient ID: Anthony Robles MRN: [id number] DOB/AGE: March 13, 1991 23 y.o.  Admit date: 03/23/2014 Discharge date: 03/24/2014  Admitting Diagnosis: Acute appendicitis  Discharge Diagnosis Patient Active Problem List   Diagnosis Date Noted  . Acute appendicitis 03/23/2014  . Appendicitis 03/23/2014    Consultants None  Imaging: Ct Abdomen Pelvis W Contrast  03/23/2014   CLINICAL DATA:  6 hour history of the right lower quadrant pain. Nausea and diarrhea  EXAM: CT ABDOMEN AND PELVIS WITH CONTRAST  TECHNIQUE: Multidetector CT imaging of the abdomen and pelvis was performed using the standard protocol following bolus administration of intravenous contrast. Oral contrast was also administered.  CONTRAST:  OMNIPAQUE IOHEXOL 300 MG/ML  SOLN  COMPARISON:  None.  FINDINGS: Lung bases are clear.  There are no focal liver lesions. Gallbladder wall is not thickened. There is no appreciable biliary duct dilatation.  Spleen, pancreas, and adrenals appear normal. Kidneys bilaterally show no appreciable mass or hydronephrosis on either side. There is no intrarenal or ureteral calculus on either side.  In the pelvis, the urinary bladder is midline with normal wall thickness. There is no pelvic mass or fluid collection.  There are several appendicoliths within the appendix. The appendix is mildly distended with subtle surrounding mesenteric thickening consistent with early acute appendicitis. No abscess seen in this region.  There is no bowel obstruction. No free air or portal venous air. The colon is largely decompressed. There are scattered colonic diverticula without diverticulitis.  There is no ascites, adenopathy, or abscess in the abdomen or pelvis. Aorta appears unremarkable. There are no blastic or lytic bone lesions.  IMPRESSION: Evidence of acute appendiceal inflammation.  No abscess. No bowel obstruction. Scattered colonic diverticula without  diverticulitis. No renal or ureteral calculus. No hydronephrosis.  Critical Value/emergent results were called by telephone at the time of interpretation on 03/23/2014 at 3:36 pm to [facility], PA, who verbally acknowledged these results.   Electronically Signed   By: Bretta Bang M.D.   On: 03/23/2014 15:36    Procedures Dr. Corliss Skains (03/23/13) - Laparoscopic Appendectomy  Hospital Course:  24 y/o white male with no significant PMH presenting to the ED for abdominal pain. Patient states sudden onset of 8/10 RLQ and right lateral abdominal pain associated with nausea and diarrhea this morning around 9:30am. He denies any vomiting. No melena or hematochezia. No prior abdominal surgeries or abdominal problems. +feverish/chills. No radiating pain, no precipitating or alleviating factors. 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 He is also an avid exerciser.   Workup showed Acute appendicitis without leukocytosis.  Patient was admitted and underwent procedure listed above.  Tolerated procedure well and was transferred to the floor.  Diet was advanced as tolerated.  On POD #1, the patient was voiding well, tolerating diet, ambulating well, pain well controlled, vital signs stable, incisions c/d/i and felt stable for discharge home.  Patient will follow up in our office in 2 weeks and knows to call with questions or concerns.   Physical Exam: General:  Alert, NAD, pleasant, comfortable Abd:  Soft, ND, mild tenderness, incisions C/D/I    Medication List    STOP taking these medications        ondansetron 8 MG disintegrating tablet  Commonly known as:  ZOFRAN ODT     promethazine 25 MG tablet  Commonly known as:  PHENERGAN      TAKE these medications        HYDROcodone-acetaminophen 5-325  MG per tablet  Commonly known as:  NORCO/VICODIN  Take 1-2 tablets by mouth every 6 (six) hours as needed for moderate pain or severe pain.     multivitamin with minerals tablet   Take 1 tablet by mouth daily.             Follow-up Information    [facility] [place] [facility].   Why:  For post-operation check. Your appointment is at 1:30pm, please arrive at least 30 min before your appointment to complete your check in paperwork.  If you are unable to arrive 30 min prior to your appointment time we may have to cancel or reschedule you   Contact information:   [address]   [place] [address] [phone]       Signed: Candiss NorseMegan Dort, PA-C [facility] [phone]  03/24/2014, 7:23 AM

## 2014-03-24 NOTE — Progress Notes (Signed)
UR completed 

## 2014-03-24 NOTE — Discharge Instructions (Signed)
Your appointment on 04/16/14 is at 1:30pm, please arrive at least 30 min before your appointment to complete your check in paperwork.  If you are unable to arrive 30 min prior to your appointment time we may have to cancel or reschedule you.  LAPAROSCOPIC SURGERY: POST OP INSTRUCTIONS  1. DIET: Follow a light bland diet the first 24 hours after arrival home, such as soup, liquids, crackers, etc. Be sure to include lots of fluids daily. Avoid fast food or heavy meals as your are more likely to get nauseated. Eat a low fat the next few days after surgery.  2. Take your usually prescribed home medications unless otherwise directed. 3. PAIN CONTROL:  1. Pain is best controlled by a usual combination of three different methods TOGETHER:  1. Ice/Heat 2. Over the counter pain medication 3. Prescription pain medication 2. Most patients will experience some swelling and bruising around the incisions. Ice packs or heating pads (30-60 minutes up to 6 times a day) will help. Use ice for the first few days to help decrease swelling and bruising, then switch to heat to help relax tight/sore spots and speed recovery. Some people prefer to use ice alone, heat alone, alternating between ice & heat. Experiment to what works for you. Swelling and bruising can take several weeks to resolve.  3. It is helpful to take an over-the-counter pain medication regularly for the first few weeks. Choose one of the following that works best for you:  1. Naproxen (Aleve, etc) Two 220mg  tabs twice a day 2. Ibuprofen (Advil, etc) Three 200mg  tabs four times a day (every meal & bedtime) 3. Acetaminophen (Tylenol, etc) 500-650mg  four times a day (every meal & bedtime) 4. A prescription for pain medication (such as oxycodone, hydrocodone, etc) should be given to you upon discharge. Take your pain medication as prescribed.  1. If you are having problems/concerns with the prescription medicine (does not control pain, nausea, vomiting, rash,  itching, etc), please call us [phone] to see if we need to switch you to a different pain medicine that will work better for you and/or control your side effect better. 2. If you need a refill on your pain medication, please contact your pharmacy. They will contact our office to request authorization. Prescriptions will not be filled after 5 pm or on week-ends. 4. Avoid getting constipated. Between the surgery and the pain medications, it is common to experience some constipation. Increasing fluid intake and taking a fiber supplement (such as Metamucil, Citrucel, FiberCon, MiraLax, etc) 1-2 times a day regularly will usually help prevent this problem from occurring. A mild laxative (prune juice, Milk of Magnesia, MiraLax, etc) should be taken according to package directions if there are no bowel movements after 48 hours.  5. Watch out for diarrhea. If you have many loose bowel movements, simplify your diet to bland foods & liquids for a few days. Stop any stool softeners and decrease your fiber supplement. Switching to mild anti-diarrheal medications (Kayopectate, Pepto Bismol) can help. If this worsens or does not improve, please call us. 6. Wash / shower every day. You may shower over the dressings as they are waterproof. Continue to shower over incision(s) after the dressing is off. 7. Remove your waterproof bandages 5 days after surgery. You may leave the incision open to air. You may replace a dressing/Band-Aid to cover the incision for comfort if you wish.  8. ACTIVITIES as tolerated:  1. You may resume regular (light) daily activities beginning the next  day--such as daily self-care, walking, climbing stairs--gradually increasing activities as tolerated. If you can walk 30 minutes without difficulty, it is safe to try more intense activity such as jogging, treadmill, bicycling, low-impact aerobics, swimming, etc. 2. Save the most intensive and strenuous activity for last such as sit-ups, heavy  lifting, contact sports, etc Refrain from any heavy lifting or straining until you are off narcotics for pain control.  3. DO NOT PUSH THROUGH PAIN. Let pain be your guide: If it hurts to do something, don't do it. Pain is your body warning you to avoid that activity for another week until the pain goes down. 4. You may drive when you are no longer taking prescription pain medication, you can comfortably wear a seatbelt, and you can safely maneuver your car and apply brakes. 5. You may have sexual intercourse when it is comfortable.  9. FOLLOW UP in our office  1. Please call [facility] at [phone] to set up an appointment to see your surgeon in the office for a follow-up appointment approximately 2-3 weeks after your surgery. 2. Make sure that you call for this appointment the day you arrive home to insure a convenient appointment time.      10. IF YOU HAVE DISABILITY OR FAMILY LEAVE FORMS, BRING THEM TO THE               OFFICE FOR PROCESSING.   WHEN TO CALL US [phone]:  1. Poor pain control 2. Reactions / problems with new medications (rash/itching, nausea, etc)  3. Fever over 101.5 F (38.5 C) 4. Inability to urinate 5. Nausea and/or vomiting 6. Worsening swelling or bruising 7. Continued bleeding from incision. 8. Increased pain, redness, or drainage from the incision  The clinic staff is available to answer your questions during regular business hours (8:30am-5pm). Please dont hesitate to call and ask to speak to one of our nurses for clinical concerns.  If you have a medical emergency, go to the nearest emergency room or call 911.  A surgeon from [facility] is always on call at the [facility]   [facility] [place]  [address], [address], [place], [place] [address] ?  MAIN: [phone] ? TOLL FREE: [phone] ?  FAX [phone]  [email or web address]

## 2014-03-25 ENCOUNTER — Encounter (HOSPITAL_COMMUNITY): Payer: Self-pay | Admitting: Surgery

## 2014-07-13 ENCOUNTER — Emergency Department (HOSPITAL_COMMUNITY)
Admission: EM | Admit: 2014-07-13 | Discharge: 2014-07-13 | Disposition: A | Discharge status: Home / Self Care | Attending: Emergency Medicine | Admitting: Emergency Medicine

## 2014-07-13 ENCOUNTER — Encounter (HOSPITAL_COMMUNITY): Payer: Self-pay | Admitting: Emergency Medicine

## 2014-07-13 DIAGNOSIS — Z79899 Other long term (current) drug therapy: Secondary | ICD-10-CM | POA: Insufficient documentation

## 2014-07-13 DIAGNOSIS — R112 Nausea with vomiting, unspecified: Secondary | ICD-10-CM | POA: Diagnosis present

## 2014-07-13 DIAGNOSIS — A084 Viral intestinal infection, unspecified: Secondary | ICD-10-CM | POA: Insufficient documentation

## 2014-07-13 LAB — COMPREHENSIVE METABOLIC PANEL
ALBUMIN: 4.6 g/dL (ref 3.5–5.2)
ALK PHOS: 93 U/L (ref 39–117)
ALT: 18 U/L (ref 0–53)
ANION GAP: 12 (ref 5–15)
AST: 14 U/L (ref 0–37)
BILIRUBIN TOTAL: 0.8 mg/dL (ref 0.3–1.2)
BUN: 13 mg/dL (ref 6–23)
CHLORIDE: 105 mmol/L (ref 96–112)
CO2: 23 mmol/L (ref 19–32)
Calcium: 9.4 mg/dL (ref 8.4–10.5)
Creatinine, Ser: 0.9 mg/dL (ref 0.50–1.35)
GFR calc Af Amer: >90 mL/min (ref >90)
GFR calc non Af Amer: >90 mL/min (ref >90)
Glucose, Bld: 113 mg/dL — ABNORMAL HIGH (ref 70–99)
POTASSIUM: 4.1 mmol/L (ref 3.5–5.1)
SODIUM: 140 mmol/L (ref 135–145)
TOTAL PROTEIN: 7.2 g/dL (ref 6.0–8.3)

## 2014-07-13 LAB — LIPASE, BLOOD: Lipase: 34 U/L (ref 11–59)

## 2014-07-13 MED ORDER — SODIUM CHLORIDE 0.9 % IV BOLUS (SEPSIS)
2000.0000 mL | Freq: Once | INTRAVENOUS | Status: AC
  Administered 2014-07-13: 2000 mL via INTRAVENOUS

## 2014-07-13 MED ORDER — ONDANSETRON HCL 4 MG/2ML IJ SOLN
4.0000 mg | Freq: Once | INTRAMUSCULAR | Status: DC
  Administered 2014-07-13: 4 mg via INTRAVENOUS

## 2014-07-13 MED ORDER — ONDANSETRON HCL 4 MG/2ML IJ SOLN
INTRAMUSCULAR | Status: AC
  Administered 2014-07-13: 4 mg via INTRAVENOUS
  Filled 2014-07-13: qty 2

## 2014-07-13 MED ORDER — ONDANSETRON HCL 4 MG PO TABS: 4.0000 mg | ORAL_TABLET | Freq: Four times a day (QID) | ORAL | Status: DC

## 2014-07-13 NOTE — ED Provider Notes (Signed)
The patient was received in sign out from Dr. Radford PaxBeaton. Briefly, this is a 24 year old male, with no pertinent past medical history, presenting with diarrhea, nausea, vomiting, starting today. Favored to represent gastroenteritis. Examination is benign.  Plan for now is to fluid resuscitate, check basic labs, administer antiemetics intravenously, by mouth challenge. The patient is able to take by mouth, we'll discharge.  Patient is taking by mouth well, without emesis. His exam remains stable, abdomen remains benign. I have given strict return precautions. I'm discharging the patient with prescription for Zofran. He expresses understanding.  I have discussed case and care has been guided by my attending physician, Silverio LayYao.  Anthony BostonStirling Adams Hinch, MD 07/13/14 1656  Richardean Canalavid H Yao, MD 07/14/14 [phone]

## 2014-07-13 NOTE — ED Notes (Signed)
Pt c/o N/V/D onset today. Pt has had 5 episodes of vomiting and diarrhea. Pt unable to tolerate PO food or fluids.

## 2014-07-13 NOTE — ED Provider Notes (Signed)
CSN: [id number]     Arrival date & time 07/13/14  1154 History   First MD Initiated Contact with Patient 07/13/14 1353     Chief Complaint  Patient presents with  . Nausea  . Emesis  . Diarrhea      HPI Pt c/o N/V/D onset today. Pt has had 5 episodes of vomiting and diarrhea. Pt unable to tolerate PO food or fluids. History reviewed. No pertinent past medical history. Past Surgical History  Procedure Laterality Date  . Tympanostomy tube placement      [age] y/o   . Wisdom tooth extraction      24 y/o  . Laparoscopic appendectomy N/A 03/23/2014    Procedure: APPENDECTOMY LAPAROSCOPIC;  Surgeon: Manus RuddMatthew Tsuei, MD;  Location: [facility] OR;  Service: General;  Laterality: N/A;   No family history on file. History  Substance Use Topics  . Smoking status: Never Smoker   . Smokeless tobacco: Not on file  . Alcohol Use: Yes     Comment: occ    Review of Systems  All other systems reviewed and are negative.     Allergies  Review of patient's allergies indicates no known allergies.  Home Medications   Prior to Admission medications   Medication Sig Start Date End Date Taking? Authorizing Provider  HYDROcodone-acetaminophen (NORCO/VICODIN) 5-325 MG per tablet Take 1-2 tablets by mouth every 6 (six) hours as needed for moderate pain or severe pain. 03/24/14   Megan N Dort, PA-C  Multiple Vitamins-Minerals (MULTIVITAMIN WITH MINERALS) tablet Take 1 tablet by mouth daily.    Historical Provider, MD  ondansetron (ZOFRAN) 4 MG tablet Take 1 tablet (4 mg total) by mouth every 6 (six) hours. 07/13/14   Nelva Nayobert Naika Noto, MD   BP 113/69 mmHg  Pulse 88  Temp(Src) 97.5 F (36.4 C) (Oral)  Resp 16  Ht 6\' 1"  (1.854 m)  Wt 190 lb (86.183 kg)  BMI 25.07 kg/m2  SpO2 100% Physical Exam  Constitutional: He is oriented to person, place, and time. He appears well-developed and well-nourished. No distress.  HENT:  Head: Normocephalic and atraumatic.  Eyes: Pupils are equal, round, and reactive to  light.  Neck: Normal range of motion.  Cardiovascular: Normal rate and intact distal pulses.   Pulmonary/Chest: No respiratory distress.  Abdominal: Normal appearance. He exhibits no distension. There is no tenderness. There is no rebound and no guarding.  Musculoskeletal: Normal range of motion.  Neurological: He is alert and oriented to person, place, and time. No cranial nerve deficit.  Skin: Skin is warm and dry. No rash noted.  Psychiatric: He has a normal mood and affect. His behavior is normal.  Nursing note and vitals reviewed.   ED Course  Procedures (including critical care time)  Medications  sodium chloride 0.9 % bolus 2,000 mL (0 mLs Intravenous Stopped 07/13/14 1648)    Labs Review Labs Reviewed  COMPREHENSIVE METABOLIC PANEL - Abnormal; Notable for the following:    Glucose, Bld 113 (*)    All other components within normal limits  LIPASE, BLOOD  CBC WITH DIFFERENTIAL/PLATELET    Imaging Review No results found.    MDM   Final diagnoses:  Viral gastroenteritis        Nelva Nayobert Anshika Pethtel, MD 07/14/14 1100

## 2014-07-13 NOTE — ED Notes (Signed)
The pt reports that he has no pain  Iv fluid still infusing.  Drinking gingerale with no problem

## 2014-07-13 NOTE — ED Notes (Signed)
Pt feels better hes ready to go home

## 2014-07-13 NOTE — Discharge Instructions (Signed)
Food Choices to Help Relieve Diarrhea °When you have diarrhea, the foods you eat and your eating habits are very important. Choosing the right foods and drinks can help relieve diarrhea. Also, because diarrhea can last up to 7 days, you need to replace lost fluids and electrolytes (such as sodium, potassium, and chloride) in order to help prevent dehydration.  °WHAT GENERAL GUIDELINES DO I NEED TO FOLLOW? °· Slowly drink 1 cup (8 oz) of fluid for each episode of diarrhea. If you are getting enough fluid, your urine will be clear or pale yellow. °· Eat starchy foods. Some good choices include white rice, white toast, pasta, low-fiber cereal, baked potatoes (without the skin), saltine crackers, and bagels. °· Avoid large servings of any cooked vegetables. °· Limit fruit to two servings per day. A serving is ½ cup or 1 small piece. °· Choose foods with less than 2 g of fiber per serving. °· Limit fats to less than 8 tsp (38 g) per day. °· Avoid fried foods. °· Eat foods that have probiotics in them. Probiotics can be found in certain dairy products. °· Avoid foods and beverages that may increase the speed at which food moves through the stomach and intestines (gastrointestinal tract). Things to avoid include: °¨ High-fiber foods, such as dried fruit, raw fruits and vegetables, nuts, seeds, and whole grain foods. °¨ Spicy foods and high-fat foods. °¨ Foods and beverages sweetened with high-fructose corn syrup, honey, or sugar alcohols such as xylitol, sorbitol, and mannitol. °WHAT FOODS ARE RECOMMENDED? °Grains °White rice. White, French, or pita breads (fresh or toasted), including plain rolls, buns, or bagels. White pasta. Saltine, soda, or graham crackers. Pretzels. Low-fiber cereal. Cooked cereals made with water (such as cornmeal, farina, or cream cereals). Plain muffins. Matzo. Melba toast. Zwieback.  °Vegetables °Potatoes (without the skin). Strained tomato and vegetable juices. Most well-cooked and canned  vegetables without seeds. Tender lettuce. °Fruits °Cooked or canned applesauce, apricots, cherries, fruit cocktail, grapefruit, peaches, pears, or plums. Fresh bananas, apples without skin, cherries, grapes, cantaloupe, grapefruit, peaches, oranges, or plums.  °Meat and Other Protein Products °Baked or boiled chicken. Eggs. Tofu. Fish. Seafood. Smooth peanut butter. Ground or well-cooked tender beef, ham, veal, lamb, pork, or poultry.  °Dairy °Plain yogurt, kefir, and unsweetened liquid yogurt. Lactose-free milk, buttermilk, or soy milk. Plain hard cheese. °Beverages °Sport drinks. Clear broths. Diluted fruit juices (except prune). Regular, caffeine-free sodas such as ginger ale. Water. Decaffeinated teas. Oral rehydration solutions. Sugar-free beverages not sweetened with sugar alcohols. °Other °Bouillon, broth, or soups made from recommended foods.  °The items listed above may not be a complete list of recommended foods or beverages. Contact your dietitian for more options. °WHAT FOODS ARE NOT RECOMMENDED? °Grains °Whole grain, whole wheat, bran, or rye breads, rolls, pastas, crackers, and cereals. Wild or brown rice. Cereals that contain more than 2 g of fiber per serving. Corn tortillas or taco shells. Cooked or dry oatmeal. Granola. Popcorn. °Vegetables °Raw vegetables. Cabbage, broccoli, Brussels sprouts, artichokes, baked beans, beet greens, corn, kale, legumes, peas, sweet potatoes, and yams. Potato skins. Cooked spinach and cabbage. °Fruits °Dried fruit, including raisins and dates. Raw fruits. Stewed or dried prunes. Fresh apples with skin, apricots, mangoes, pears, raspberries, and strawberries.  °Meat and Other Protein Products °Chunky peanut butter. Nuts and seeds. Beans and lentils. Bacon.  °Dairy °High-fat cheeses. Milk, chocolate milk, and beverages made with milk, such as milk shakes. Cream. Ice cream. °Sweets and Desserts °Sweet rolls, doughnuts, and sweet breads. Pancakes   and waffles. °Fats and  Oils °Butter. Cream sauces. Margarine. Salad oils. Plain salad dressings. Olives. Avocados.  °Beverages °Caffeinated beverages (such as coffee, tea, soda, or energy drinks). Alcoholic beverages. Fruit juices with pulp. Prune juice. Soft drinks sweetened with high-fructose corn syrup or sugar alcohols. °Other °Coconut. Hot sauce. Chili powder. Mayonnaise. Gravy. Cream-based or milk-based soups.  °The items listed above may not be a complete list of foods and beverages to avoid. Contact your dietitian for more information. °WHAT SHOULD I DO IF I BECOME DEHYDRATED? °Diarrhea can sometimes lead to dehydration. Signs of dehydration include dark urine and dry mouth and skin. If you think you are dehydrated, you should rehydrate with an oral rehydration solution. These solutions can be purchased at pharmacies, retail stores, or online.  °Drink ½-1 cup (120-240 mL) of oral rehydration solution each time you have an episode of diarrhea. If drinking this amount makes your diarrhea worse, try drinking smaller amounts more often. For example, drink 1-3 tsp (5-15 mL) every 5-10 minutes.  °A general rule for staying hydrated is to drink 1½-2 L of fluid per day. Talk to your health care provider about the specific amount you should be drinking each day. Drink enough fluids to keep your urine clear or pale yellow. °Document Released: 05/22/2003 Document Revised: 03/06/2013 Document Reviewed: 01/22/2013 °[employer]® Patient Information ©2015 [employer]. This information is not intended to replace advice given to you by your health care provider. Make sure you discuss any questions you have with your health care provider. °Viral Gastroenteritis °Viral gastroenteritis is also called stomach flu. This illness is caused by a certain type of germ (virus). It can cause sudden watery poop (diarrhea) and throwing up (vomiting). This can cause you to lose body fluids (dehydration). This illness usually lasts for 3 to 8 days. It usually goes  away on its own. °HOME CARE  °· Drink enough fluids to keep your pee (urine) clear or pale yellow. Drink small amounts of fluids often. °· Ask your doctor how to replace body fluid losses (rehydration). °· Avoid: °¨ Foods high in sugar. °¨ Alcohol. °¨ Bubbly (carbonated) drinks. °¨ Tobacco. °¨ Juice. °¨ Caffeine drinks. °¨ Very hot or cold fluids. °¨ Fatty, greasy foods. °¨ Eating too much at one time. °¨ Dairy products until 24 to 48 hours after your watery poop stops. °· You may eat foods with active cultures (probiotics). They can be found in some yogurts and supplements. °· Wash your hands well to avoid spreading the illness. °· Only take medicines as told by your doctor. Do not give aspirin to children. Do not take medicines for watery poop (antidiarrheals). °· Ask your doctor if you should keep taking your regular medicines. °· Keep all doctor visits as told. °GET HELP RIGHT AWAY IF:  °· You cannot keep fluids down. °· You do not pee at least once every 6 to 8 hours. °· You are short of breath. °· You see blood in your poop or throw up. This may look like coffee grounds. °· You have belly (abdominal) pain that gets worse or is just in one small spot (localized). °· You keep throwing up or having watery poop. °· You have a fever. °· The patient is a child younger than 3 months, and he or she has a fever. °· The patient is a child older than 3 months, and he or she has a fever and problems that do not go away. °· The patient is a child older than 3 months,   and he or she has a fever and problems that suddenly get worse. °· The patient is a baby, and he or she has no tears when crying. °MAKE SURE YOU:  °· Understand these instructions. °· Will watch your condition. °· Will get help right away if you are not doing well or get worse. °Document Released: 08/18/2007 Document Revised: 05/24/2011 Document Reviewed: 12/16/2010 °[employer]® Patient Information ©2015 [employer]. This information is not intended to replace  advice given to you by your health care provider. Make sure you discuss any questions you have with your health care provider. ° °

## 2016-01-12 IMAGING — CT CT ABD-PELV W/ CM
2 of 4 series · 15 of 46 positions shown, 17 images · IV contrast (Omni 300)
Comparison: None.

CLINICAL DATA: 6 hour history of the right lower quadrant pain.
Nausea and diarrhea

EXAM:
CT ABDOMEN AND PELVIS WITH CONTRAST
TECHNIQUE: Multidetector CT imaging of the abdomen and pelvis was performed
using the standard protocol following bolus administration of
intravenous contrast. Oral contrast was also administered.
CONTRAST:  100mL OMNIPAQUE IOHEXOL 300 MG/ML  SOLN

[Series 2: abd/ pelvis 5.0 i30f 1 · axial · 0.70mm/px · z∈[+870,+1310]mm · 12 of 104 slices shown, 14 images]
[im 8/104  soft-tissue]
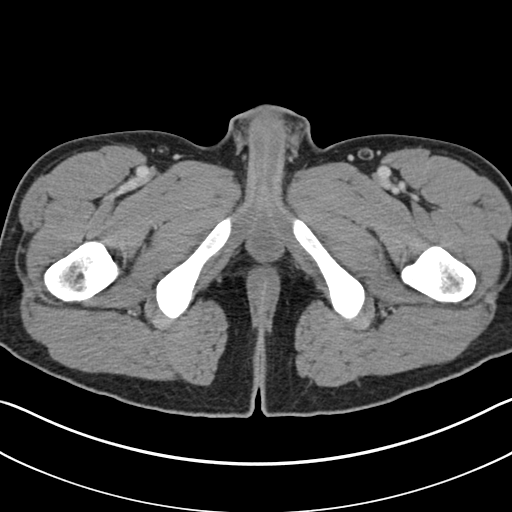
[im 8/104  bone]
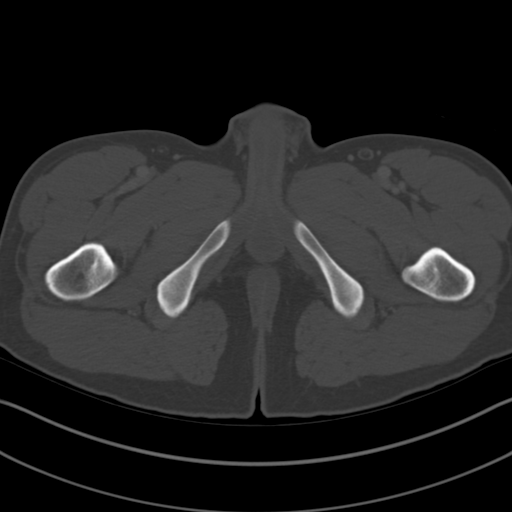
[im 16/104  soft-tissue]
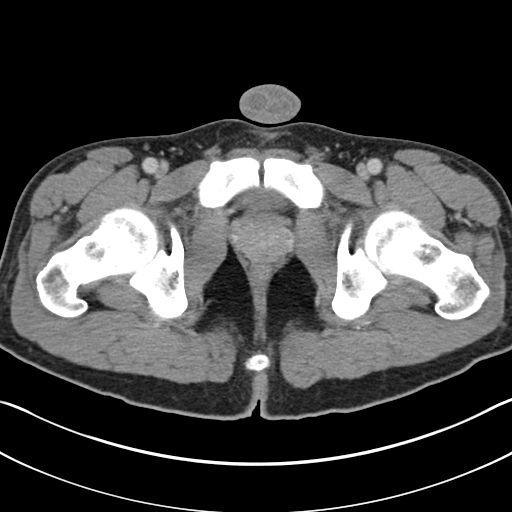
[im 24/104  soft-tissue]
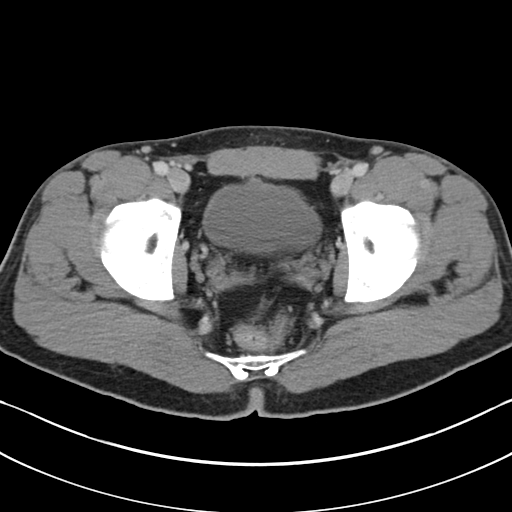
[im 32/104  soft-tissue]
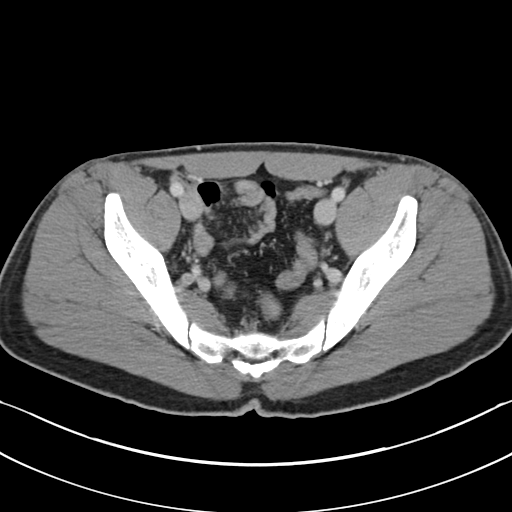
[im 40/104  soft-tissue]
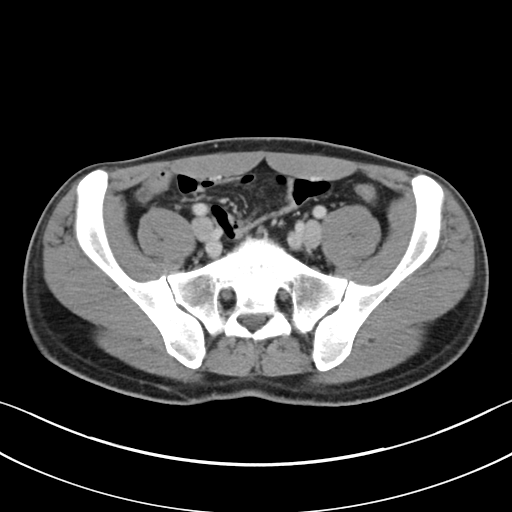
[im 48/104  soft-tissue]
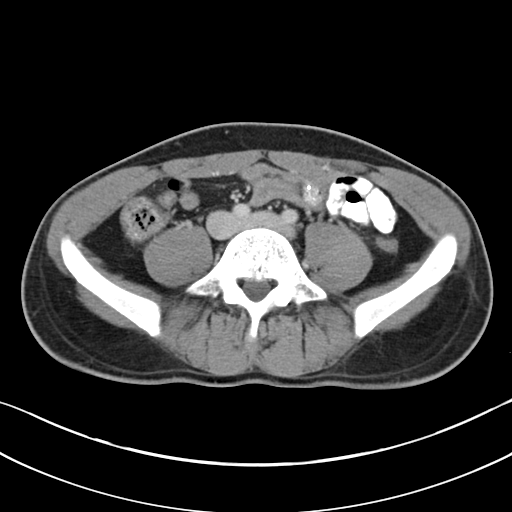
[im 56/104  soft-tissue]
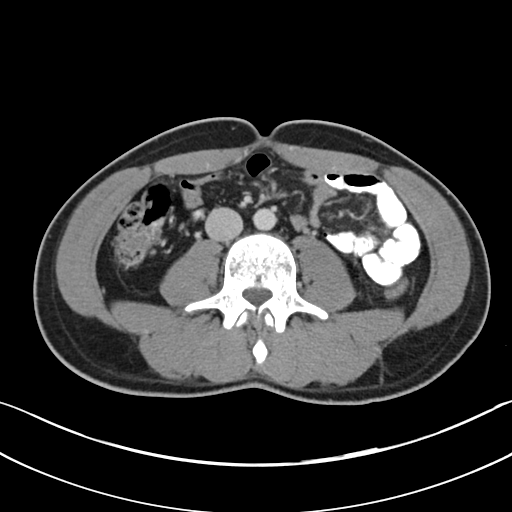
[im 64/104  soft-tissue]
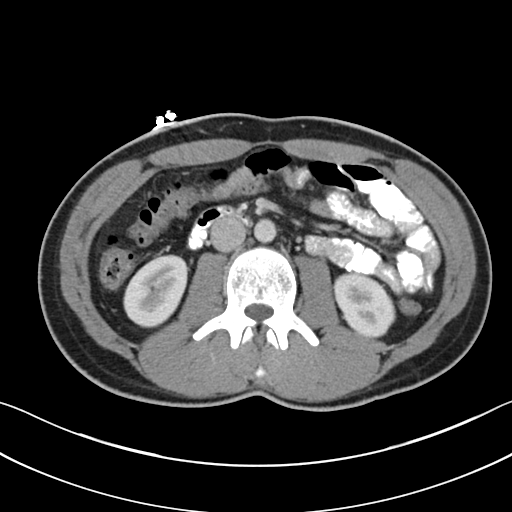
[im 72/104  soft-tissue]
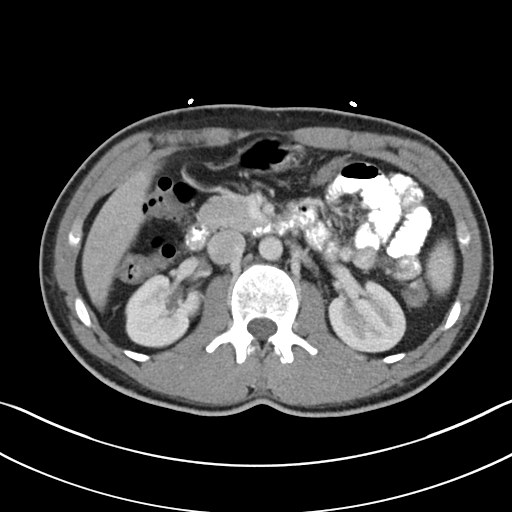
[im 72/104  bone]
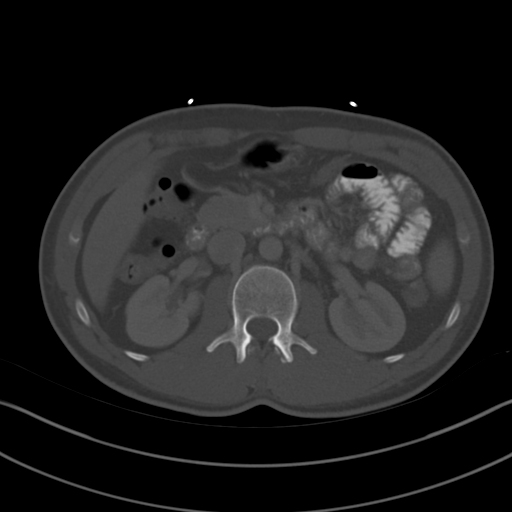
[im 80/104  soft-tissue]
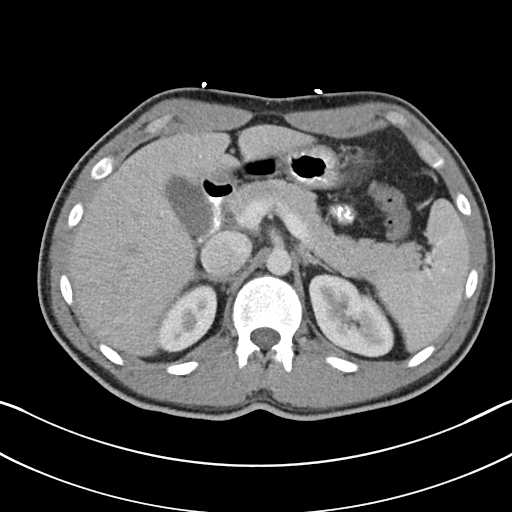
[im 88/104  soft-tissue]
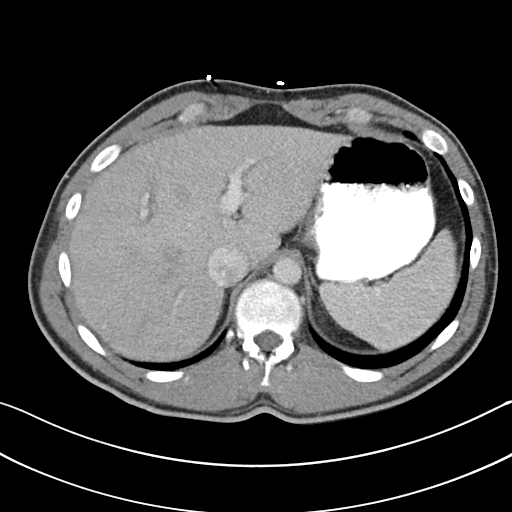
[im 96/104  soft-tissue]
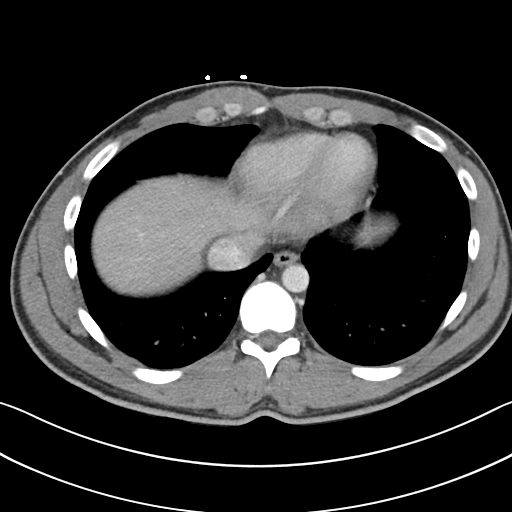

[Series 5: coronals · coronal · 0.71mm/px · 3 of 120 slices shown]
[im 40/120  soft-tissue]
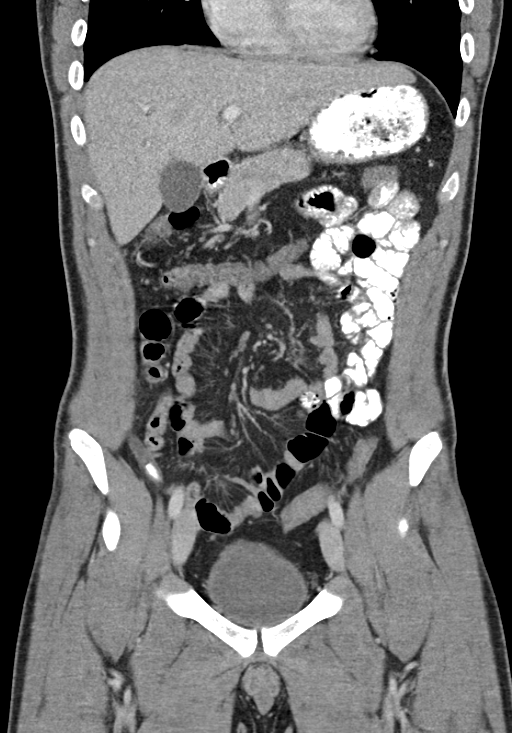
[im 53/120  soft-tissue]
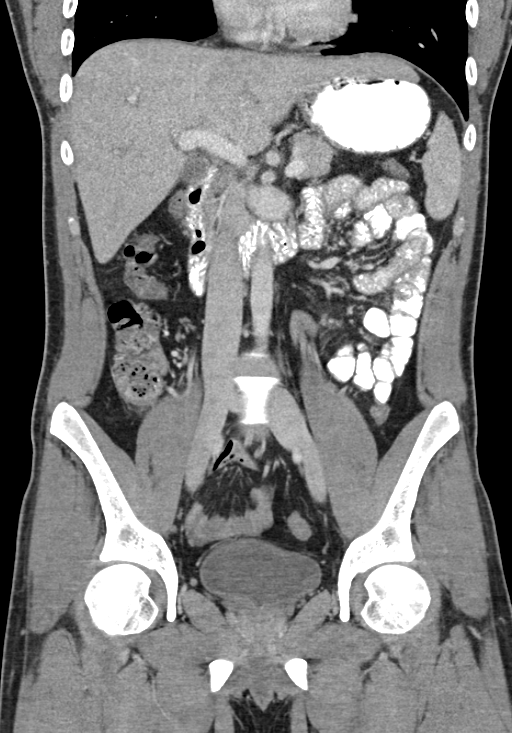
[im 67/120  soft-tissue]
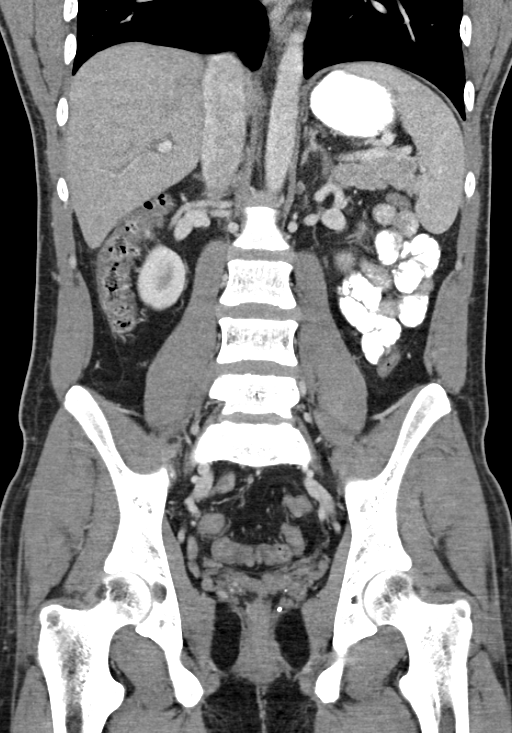

[15 of 46 positions shown; findings below may reference images not displayed]

FINDINGS: Lung bases are clear.

There are no focal liver lesions. Gallbladder wall is not thickened.
There is no appreciable biliary duct dilatation.

Spleen, pancreas, and adrenals appear normal. Kidneys bilaterally
show no appreciable mass or hydronephrosis on either side. There is
no intrarenal or ureteral calculus on either side.

In the pelvis, the urinary bladder is midline with normal wall
thickness. There is no pelvic mass or fluid collection.

There are several appendicoliths within the appendix. The appendix
is mildly distended with subtle surrounding mesenteric thickening
consistent with early acute appendicitis. No abscess seen in this
region.

There is no bowel obstruction. No free air or portal venous air. The
colon is largely decompressed. There are scattered colonic
diverticula without diverticulitis.

There is no ascites, adenopathy, or abscess in the abdomen or
pelvis. Aorta appears unremarkable. There are no blastic or lytic
bone lesions.
IMPRESSION: Evidence of acute appendiceal inflammation.

No abscess. No bowel obstruction. Scattered colonic diverticula
without diverticulitis. No renal or ureteral calculus. No
hydronephrosis.

Critical Value/emergent results were called by telephone at the time
of interpretation on 03/23/2014 at [DATE] to BENDZSI HENRIETT, PA, who
verbally acknowledged these results.

## 2016-01-23 ENCOUNTER — Encounter (HOSPITAL_COMMUNITY): Payer: Self-pay | Admitting: Emergency Medicine

## 2016-01-23 ENCOUNTER — Emergency Department (HOSPITAL_COMMUNITY)
Admission: EM | Admit: 2016-01-23 | Discharge: 2016-01-23 | Disposition: A | Discharge status: Home / Self Care | Attending: Emergency Medicine | Admitting: Emergency Medicine

## 2016-01-23 ENCOUNTER — Emergency Department (HOSPITAL_COMMUNITY)

## 2016-01-23 DIAGNOSIS — A084 Viral intestinal infection, unspecified: Secondary | ICD-10-CM

## 2016-01-23 DIAGNOSIS — R112 Nausea with vomiting, unspecified: Secondary | ICD-10-CM

## 2016-01-23 DIAGNOSIS — R1010 Upper abdominal pain, unspecified: Secondary | ICD-10-CM

## 2016-01-23 DIAGNOSIS — R197 Diarrhea, unspecified: Secondary | ICD-10-CM

## 2016-01-23 LAB — CBC WITH DIFFERENTIAL/PLATELET
BASOS ABS: 0 10*3/uL (ref 0.0–0.1)
BASOS PCT: 0 %
Eosinophils Absolute: 0.2 10*3/uL (ref 0.0–0.7)
Eosinophils Relative: 2 %
HCT: 44.2 % (ref 39.0–52.0)
Hemoglobin: 15.9 g/dL (ref 13.0–17.0)
Lymphocytes Relative: 9 %
Lymphs Abs: 0.9 10*3/uL (ref 0.7–4.0)
MCH: 30.6 pg (ref 26.0–34.0)
MCHC: 36 g/dL (ref 30.0–36.0)
MCV: 85.2 fL (ref 78.0–100.0)
Monocytes Absolute: 0.4 10*3/uL (ref 0.1–1.0)
Monocytes Relative: 4 %
Neutro Abs: 8.3 10*3/uL — ABNORMAL HIGH (ref 1.7–7.7)
Neutrophils Relative %: 85 %
PLATELETS: 190 10*3/uL (ref 150–400)
RBC: 5.19 MIL/uL (ref 4.22–5.81)
RDW: 12.6 % (ref 11.5–15.5)
WBC: 9.8 10*3/uL (ref 4.0–10.5)

## 2016-01-23 LAB — LIPASE, BLOOD: Lipase: 29 U/L (ref 11–51)

## 2016-01-23 LAB — COMPREHENSIVE METABOLIC PANEL
ALT: 22 U/L (ref 17–63)
AST: 14 U/L — AB (ref 15–41)
Albumin: 4.7 g/dL (ref 3.5–5.0)
Alkaline Phosphatase: 63 U/L (ref 38–126)
Anion gap: 9 (ref 5–15)
BILIRUBIN TOTAL: 0.5 mg/dL (ref 0.3–1.2)
BUN: 17 mg/dL (ref 6–20)
CALCIUM: 9.6 mg/dL (ref 8.9–10.3)
CO2: 24 mmol/L (ref 22–32)
Chloride: 108 mmol/L (ref 101–111)
Creatinine, Ser: 0.87 mg/dL (ref 0.61–1.24)
GFR calc Af Amer: >60 mL/min (ref >60)
Glucose, Bld: 100 mg/dL — ABNORMAL HIGH (ref 65–99)
POTASSIUM: 3.9 mmol/L (ref 3.5–5.1)
Sodium: 141 mmol/L (ref 135–145)
TOTAL PROTEIN: 7.2 g/dL (ref 6.5–8.1)

## 2016-01-23 LAB — URINALYSIS, ROUTINE W REFLEX MICROSCOPIC
Bilirubin Urine: NEGATIVE
Glucose, UA: NEGATIVE mg/dL
HGB URINE DIPSTICK: NEGATIVE
KETONES UR: NEGATIVE mg/dL
Leukocytes, UA: NEGATIVE
Nitrite: NEGATIVE
PROTEIN: NEGATIVE mg/dL
Specific Gravity, Urine: 1.024 (ref 1.005–1.030)
pH: 7 (ref 5.0–8.0)

## 2016-01-23 MED ORDER — SODIUM CHLORIDE 0.9 % IV BOLUS (SEPSIS)
2000.0000 mL | Freq: Once | INTRAVENOUS | Status: AC
  Administered 2016-01-23: 2000 mL via INTRAVENOUS

## 2016-01-23 MED ORDER — DICYCLOMINE HCL 10 MG/ML IM SOLN
20.0000 mg | Freq: Once | INTRAMUSCULAR | Status: AC
  Administered 2016-01-23: 20 mg via INTRAMUSCULAR
  Filled 2016-01-23: qty 2

## 2016-01-23 MED ORDER — DICYCLOMINE HCL 20 MG PO TABS: 20.0000 mg | ORAL_TABLET | Freq: Two times a day (BID) | ORAL | 0 refills | Status: DC | PRN

## 2016-01-23 MED ORDER — ONDANSETRON 8 MG PO TBDP: 8.0000 mg | ORAL_TABLET | Freq: Three times a day (TID) | ORAL | 0 refills | Status: DC | PRN

## 2016-01-23 MED ORDER — ONDANSETRON HCL 4 MG/2ML IJ SOLN
4.0000 mg | Freq: Once | INTRAMUSCULAR | Status: AC
  Administered 2016-01-23: 4 mg via INTRAVENOUS
  Filled 2016-01-23: qty 2

## 2016-01-23 NOTE — Discharge Instructions (Signed)
Your symptoms are likely due to a viral gastroenteritis illness. Use zofran as prescribed, as needed for nausea. Use bentyl as directed as needed for abdominal cramping and diarrhea. Use tylenol or motrin as needed for pain. Stay well hydrated with small sips of clear fluids (gatorade, sprite, gingerale, etc) throughout the day. Follow a BRAT (banana-rice-applesauce-toast) diet as described below for the next 24-48 hours. The 'BRAT' diet is suggested, then progress to diet as tolerated as symptoms abate. You may consider using over the counter zantac as needed for indigestion type symptoms, and avoid spicy/fatty/acidic foods, avoid coffee/tea/alcohol. Avoid laying down flat within 30 minutes of eating. Avoid NSAIDs like ibuprofen/aleve/motrin/etc on an empty stomach. May consider using over the counter tums/maalox as needed for additional relief. Call your regular doctor if bloody stools, persistent diarrhea, vomiting, fever or abdominal pain. Follow up with your regular doctor in 1 week for recheck of symptoms. Return to ER for changing or worsening of symptoms.

## 2016-01-23 NOTE — ED Notes (Signed)
Pt in bathroom, unable to triage at this time. 

## 2016-01-23 NOTE — ED Provider Notes (Signed)
[facility]-EMERGENCY DEPT Provider Note   CSN: [id number] Arrival date & time: 01/23/16  0702     History   Chief Complaint Chief Complaint  Patient presents with  . Emesis  . Abdominal Pain    HPI Anthony Robles is a 25 y.o. male with no significant PMHx and with a PSHx of appendectomy, who presents to the ED with complaints of sudden onset nausea, vomiting, diarrhea, and intermittent abdominal pain that began around 4 AM this morning. Patient states he awoke, didn't feel well, had 10 episodes of nonbloody nonbilious emesis consisting of undigested stomach contents and some stomach acids, and more than 10 episodes of nonbloody watery dark diarrhea that was not melanotic. He states that he was shaking and felt "dehydrated" and generally weak, which is what prompted him to come to the emergency room. He describes his abdominal pain as 3/10 sharp intermittent right lower quadrant pain that is nonradiating, worse with needing to have a bowel movement, and improved after bowel movements. No other treatments tried prior to arrival. He admits to drinking 2-3 glasses of wine last night, only drinks occasionally.  He denies any fevers, chills, chest pain, shortness breath, hematemesis, melena, hematochezia, constipation, obstipation, dysuria, hematuria, numbness, tingling, or focal weakness. Denies sick contacts, recent travel, suspicious food intake, NSAID use, or recent antibiotics.   The history is provided by the patient and medical records. No language interpreter was used.  Emesis   Associated symptoms include abdominal pain and diarrhea. Pertinent negatives include no arthralgias, no chills, no fever and no myalgias.  Abdominal Pain   This is a new problem. The current episode started 3 to 5 hours ago. The problem occurs hourly. The problem has been gradually improving. The pain is associated with an unknown factor. The pain is located in the RLQ. The quality of the pain is sharp. The pain is at a  severity of 5/10. The pain is mild. Associated symptoms include diarrhea, nausea and vomiting. Pertinent negatives include fever, flatus, hematochezia, melena, constipation, dysuria, hematuria, arthralgias and myalgias. Exacerbated by: needing to have a BM. The symptoms are relieved by bowel activity.    History reviewed. No pertinent past medical history.  Patient Active Problem List   Diagnosis Date Noted  . Acute appendicitis 03/23/2014  . Appendicitis 03/23/2014    Past Surgical History:  Procedure Laterality Date  . APPENDECTOMY    . LAPAROSCOPIC APPENDECTOMY N/A 03/23/2014   Procedure: APPENDECTOMY LAPAROSCOPIC;  Surgeon: Manus Rudd, MD;  Location: [facility] OR;  Service: General;  Laterality: N/A;  . TYMPANOSTOMY TUBE PLACEMENT     [age] y/o   . WISDOM TOOTH EXTRACTION     25 y/o       Home Medications    Prior to Admission medications   Medication Sig Start Date End Date Taking? Authorizing Provider  HYDROcodone-acetaminophen (NORCO/VICODIN) 5-325 MG per tablet Take 1-2 tablets by mouth every 6 (six) hours as needed for moderate pain or severe pain. 03/24/14   Nonie Hoyer, PA-C  Multiple Vitamins-Minerals (MULTIVITAMIN WITH MINERALS) tablet Take 1 tablet by mouth daily.    Historical Provider, MD  ondansetron (ZOFRAN) 4 MG tablet Take 1 tablet (4 mg total) by mouth every 6 (six) hours. 07/13/14   Nelva Nay, MD    Family History History reviewed. No pertinent family history.  Social History Social History  Substance Use Topics  . Smoking status: Never Smoker  . Smokeless tobacco: Not on file  . Alcohol use Yes     Comment:  occ     Allergies   Patient has no known allergies.   Review of Systems Review of Systems  Constitutional: Positive for fatigue (generalized weakness/dehydrated feeling). Negative for chills and fever.  Respiratory: Negative for shortness of breath.   Cardiovascular: Negative for chest pain.  Gastrointestinal: Positive for abdominal pain,  diarrhea, nausea and vomiting. Negative for blood in stool, constipation, flatus, hematochezia and melena.  Genitourinary: Negative for dysuria and hematuria.  Musculoskeletal: Negative for arthralgias and myalgias.  Skin: Negative for color change.  Allergic/Immunologic: Negative for immunocompromised state.  Neurological: Negative for weakness and numbness.  Psychiatric/Behavioral: Negative for confusion.   10 Systems reviewed and are negative for acute change except as noted in the HPI.   Physical Exam Updated Vital Signs BP 131/82 (BP Location: Left Arm)   Pulse 83   Temp 97.5 F (36.4 C) (Oral)   Resp 16   SpO2 100%   Physical Exam  Constitutional: He is oriented to person, place, and time. Vital signs are normal. He appears well-developed and well-nourished.  Non-toxic appearance. No distress.  Afebrile, nontoxic, NAD  HENT:  Head: Normocephalic and atraumatic.  Mouth/Throat: Oropharynx is clear and moist. Mucous membranes are dry.  Mildly dry mucous membranes  Eyes: Conjunctivae and EOM are normal. Right eye exhibits no discharge. Left eye exhibits no discharge.  Neck: Normal range of motion. Neck supple.  Cardiovascular: Normal rate, regular rhythm, normal heart sounds and intact distal pulses.  Exam reveals no gallop and no friction rub.   No murmur heard. Pulmonary/Chest: Effort normal and breath sounds normal. No respiratory distress. He has no decreased breath sounds. He has no wheezes. He has no rhonchi. He has no rales.  Abdominal: Soft. Normal appearance and bowel sounds are normal. He exhibits no distension. There is tenderness in the right upper quadrant. There is positive Murphy's sign. There is no rigidity, no rebound, no guarding, no CVA tenderness and no tenderness at McBurney's point.  Soft, nondistended, +BS throughout, with mild RUQ TTP, no r/g/r, +murphy's, neg mcburney's, no CVA TTP   Musculoskeletal: Normal range of motion.  Neurological: He is alert and  oriented to person, place, and time. He has normal strength. No sensory deficit.  Skin: Skin is warm, dry and intact. No rash noted.  Psychiatric: He has a normal mood and affect.  Nursing note and vitals reviewed.    ED Treatments / Results  Labs (all labs ordered are listed, but only abnormal results are displayed) Labs Reviewed  COMPREHENSIVE METABOLIC PANEL - Abnormal; Notable for the following:       Result Value   Glucose, Bld 100 (*)    AST 14 (*)    All other components within normal limits  URINALYSIS, ROUTINE W REFLEX MICROSCOPIC (NOT AT [facility]) - Abnormal; Notable for the following:    APPearance CLOUDY (*)    All other components within normal limits  CBC WITH DIFFERENTIAL/PLATELET - Abnormal; Notable for the following:    Neutro Abs 8.3 (*)    All other components within normal limits  LIPASE, BLOOD    EKG  EKG Interpretation None       Radiology [place] Abdomen Complete  Result Date: 01/23/2016 CLINICAL DATA:  Upper abdominal pain EXAM: ABDOMEN ULTRASOUND COMPLETE COMPARISON:  CT abdomen and pelvis March 23, 2014 FINDINGS: Gallbladder: No gallstones or wall thickening visualized. There is no pericholecystic fluid. No sonographic Murphy sign noted by sonographer. Common bile duct: Diameter: 4 mm. There is no intrahepatic, common hepatic, or  common bile duct dilatation. Liver: No focal lesion identified. Within normal limits in parenchymal echogenicity. IVC: No abnormality visualized. Pancreas: No mass or inflammatory focus. Spleen: Size and appearance within normal limits. Right Kidney: Length: 12.6 cm. Echogenicity within normal limits. No mass or hydronephrosis visualized. Left Kidney: Length: 13.3 cm. Echogenicity within normal limits. No mass or hydronephrosis visualized. Abdominal aorta: No aneurysm visualized. Other findings: No demonstrable ascites. IMPRESSION: Study within normal limits. Electronically Signed   By: Bretta BangWilliam  Woodruff III M.D.   On: 01/23/2016 09:56      Procedures Procedures (including critical care time)  Medications Ordered in ED Medications  ondansetron (ZOFRAN) injection 4 mg (4 mg Intravenous Given 01/23/16 0828)  dicyclomine (BENTYL) injection 20 mg (20 mg Intramuscular Given 01/23/16 0828)  sodium chloride 0.9 % bolus 2,000 mL (2,000 mLs Intravenous New Bag/Given 01/23/16 0827)     Initial Impression / Assessment and Plan / ED Course  I have reviewed the triage vital signs and the nursing notes.  Pertinent labs & imaging results that were available during my care of the patient were reviewed by me and considered in my medical decision making (see chart for details).  Clinical Course     25 y.o. male here with sudden onset nausea vomiting diarrhea and intermittent R sided abdominal pain. No melena or bilious emesis, contrary to what triage note reports. Exam reveals moderate right upper quadrant tenderness, positive Murphy's. Will obtain labs and abdominal ultrasound. Will give fluids, Bentyl, and Zofran. Patient declines pain medicine. Will reassess shortly.  10:47 AM CBC w/diff unremarkable. CMP WNL. Lipase WNL. U/A unremarkable. U/S without acute findings. Overall symptoms c/w viral gastroenteritis. Pt feeling much better, symptoms resolved, no ongoing diarrhea or vomiting. Will PO challenge. Likely could go home with zofran and bentyl; discussed OTC zantac/tums/maalox PRN additional relief. BRAT diet discussed. Could f/up with PCP in 1wk for recheck. Will reassess after PO challenge.  11:21 AM Tolerating PO well, feels well. Will d/c with previously discussed plan. I explained the diagnosis and have given explicit precautions to return to the ER including for any other new or worsening symptoms. The patient understands and accepts the medical plan as it's been dictated and I have answered their questions. Discharge instructions concerning home care and prescriptions have been given. The patient is STABLE and is discharged to  home in good condition.   Final Clinical Impressions(s) / ED Diagnoses   Final diagnoses:  Nausea vomiting and diarrhea  Viral gastroenteritis  Intermittent upper abdominal pain    New Prescriptions New Prescriptions   DICYCLOMINE (BENTYL) 20 MG TABLET    Take 1 tablet (20 mg total) by mouth 2 (two) times daily as needed for spasms (diarrhea and abdominal cramping).   ONDANSETRON (ZOFRAN ODT) 8 MG DISINTEGRATING TABLET    Take 1 tablet (8 mg total) by mouth every 8 (eight) hours as needed for nausea or vomiting.     Govanni Plemons Camprubi-Soms, PA-C 01/23/16 1121    Lorre NickAnthony Allen, MD 01/25/16 [phone]

## 2016-01-23 NOTE — ED Notes (Signed)
Ultrasound at bedside

## 2016-01-23 NOTE — ED Triage Notes (Addendum)
Pt c/o headache, low medial abdominal pain, and malaise onset at 0400 today, followed by emesis and dark diarrhea onset 0415 today, followed by weakness and dizziness and shaking, dry heaving with scant bile. No sick contacts, no unusual food or medications.

## 2019-12-17 ENCOUNTER — Other Ambulatory Visit

## 2019-12-17 ENCOUNTER — Other Ambulatory Visit: Payer: Self-pay

## 2019-12-17 DIAGNOSIS — Z20822 Contact with and (suspected) exposure to covid-19: Secondary | ICD-10-CM | POA: Diagnosis not present

## 2019-12-18 LAB — SARS-COV-2, NAA 2 DAY TAT

## 2019-12-18 LAB — NOVEL CORONAVIRUS, NAA: SARS-CoV-2, NAA: NOT DETECTED

## 2020-02-18 ENCOUNTER — Telehealth: Payer: Self-pay

## 2020-02-18 DIAGNOSIS — Z20822 Contact with and (suspected) exposure to covid-19: Secondary | ICD-10-CM | POA: Diagnosis not present

## 2020-02-18 NOTE — Telephone Encounter (Signed)
Pt started 02/16/20 with fever 102.9, throwing up with blood streaks in vomitus; not sure if stomach or lung related; today pt has head congestion T 100.5; when coughs has CP and SOB; watery diarrhea started 2 hrs ago. Pt did home covid test with + results. pts wife will take pt to [facility] now and will cb to establish care with one of our providers. Sending note to [facility] office admin since no PCP to send note to.

## 2020-02-19 NOTE — Telephone Encounter (Signed)
Anthony Robles mother called very concerned about him giving his symptoms.  He is not a patient of ours yet but is wanting to establish.  He was connected with a nurse who instructed him to go to UC or ER for symptoms and care.  He will follow up to establish with our office thereafter.

## 2020-02-21 ENCOUNTER — Telehealth: Payer: Self-pay | Admitting: Nurse Practitioner

## 2020-02-21 NOTE — Telephone Encounter (Signed)
Called to discuss with Anthony Robles about Covid symptoms and the use of the monoclonal antibody infusion for those with mild to moderate Covid symptoms and at a high risk of hospitalization.   Pt is not qualified for this infusion due to lack of identified risk factors and co-morbid conditions.  Symptoms reviewed as well as criteria for ending isolation.  Symptoms reviewed that would warrant ED/Hospital evaluation as well should her condition worsen. Preventative practices reviewed. Wife verbalized understanding.       Patient Active Problem List   Diagnosis Date Noted  . Acute appendicitis 03/23/2014  . Appendicitis 03/23/2014    Willette Alma, AGPCNP-BC

## 2020-02-26 DIAGNOSIS — Z20822 Contact with and (suspected) exposure to covid-19: Secondary | ICD-10-CM | POA: Diagnosis not present

## 2020-02-28 DIAGNOSIS — Z20822 Contact with and (suspected) exposure to covid-19: Secondary | ICD-10-CM | POA: Diagnosis not present

## 2020-03-04 ENCOUNTER — Emergency Department (HOSPITAL_COMMUNITY): Payer: BC Managed Care – PPO

## 2020-03-04 ENCOUNTER — Emergency Department (HOSPITAL_COMMUNITY)
Admission: EM | Admit: 2020-03-04 | Discharge: 2020-03-05 | Disposition: A | Discharge status: Home / Self Care | Payer: BC Managed Care – PPO | Attending: Emergency Medicine | Admitting: Emergency Medicine

## 2020-03-04 ENCOUNTER — Other Ambulatory Visit: Payer: Self-pay

## 2020-03-04 DIAGNOSIS — R918 Other nonspecific abnormal finding of lung field: Secondary | ICD-10-CM | POA: Diagnosis not present

## 2020-03-04 DIAGNOSIS — Z20822 Contact with and (suspected) exposure to covid-19: Secondary | ICD-10-CM | POA: Diagnosis not present

## 2020-03-04 DIAGNOSIS — R112 Nausea with vomiting, unspecified: Secondary | ICD-10-CM

## 2020-03-04 DIAGNOSIS — R000 Tachycardia, unspecified: Secondary | ICD-10-CM | POA: Diagnosis not present

## 2020-03-04 DIAGNOSIS — J690 Pneumonitis due to inhalation of food and vomit: Secondary | ICD-10-CM | POA: Diagnosis present

## 2020-03-04 DIAGNOSIS — R059 Cough, unspecified: Secondary | ICD-10-CM

## 2020-03-04 DIAGNOSIS — R0602 Shortness of breath: Secondary | ICD-10-CM | POA: Diagnosis not present

## 2020-03-04 HISTORY — DX: COVID-19: U07.1

## 2020-03-04 HISTORY — DX: Unspecified appendicitis: K37

## 2020-03-04 LAB — CBC
HCT: 41.7 % (ref 39.0–52.0)
Hemoglobin: 15.3 g/dL (ref 13.0–17.0)
MCH: 30.4 pg (ref 26.0–34.0)
MCHC: 36.7 g/dL — ABNORMAL HIGH (ref 30.0–36.0)
MCV: 82.9 fL (ref 80.0–100.0)
Platelets: 355 10*3/uL (ref 150–400)
RBC: 5.03 MIL/uL (ref 4.22–5.81)
RDW: 12 % (ref 11.5–15.5)
WBC: 11.8 10*3/uL — ABNORMAL HIGH (ref 4.0–10.5)
nRBC: 0 % (ref 0.0–0.2)

## 2020-03-04 NOTE — ED Triage Notes (Addendum)
Pt here for eval of SHOB/aspiration, states he was drinking something & approximately a mouthful "went down the wrong tube." Says he feels like he "can't breathe." Pt able to communicate effectively in triage & speak in full, measured sentences; states he's been "coughing stuff up" & "vomiting a lot" since time of incident- approx 1hr ago. Breathing symmetrical but labored at times, especially after coughing.

## 2020-03-05 ENCOUNTER — Encounter (HOSPITAL_COMMUNITY): Payer: Self-pay | Admitting: Internal Medicine

## 2020-03-05 ENCOUNTER — Emergency Department (HOSPITAL_COMMUNITY): Payer: BC Managed Care – PPO

## 2020-03-05 DIAGNOSIS — J690 Pneumonitis due to inhalation of food and vomit: Secondary | ICD-10-CM | POA: Diagnosis present

## 2020-03-05 DIAGNOSIS — R112 Nausea with vomiting, unspecified: Secondary | ICD-10-CM | POA: Diagnosis not present

## 2020-03-05 DIAGNOSIS — R059 Cough, unspecified: Secondary | ICD-10-CM | POA: Diagnosis not present

## 2020-03-05 DIAGNOSIS — R000 Tachycardia, unspecified: Secondary | ICD-10-CM | POA: Diagnosis not present

## 2020-03-05 DIAGNOSIS — R918 Other nonspecific abnormal finding of lung field: Secondary | ICD-10-CM | POA: Diagnosis not present

## 2020-03-05 LAB — RESP PANEL BY RT-PCR (FLU A&B, COVID) ARPGX2
Influenza A by PCR: NEGATIVE
Influenza B by PCR: NEGATIVE
SARS Coronavirus 2 by RT PCR: NEGATIVE

## 2020-03-05 LAB — BASIC METABOLIC PANEL
Anion gap: 17 — ABNORMAL HIGH (ref 5–15)
BUN: 14 mg/dL (ref 6–20)
CO2: 21 mmol/L — ABNORMAL LOW (ref 22–32)
Calcium: 9.4 mg/dL (ref 8.9–10.3)
Chloride: 99 mmol/L (ref 98–111)
Creatinine, Ser: 0.83 mg/dL (ref 0.61–1.24)
GFR, Estimated: >60 mL/min (ref >60)
Glucose, Bld: 125 mg/dL — ABNORMAL HIGH (ref 70–99)
Potassium: 3.6 mmol/L (ref 3.5–5.1)
Sodium: 137 mmol/L (ref 135–145)

## 2020-03-05 LAB — CBC WITH DIFFERENTIAL/PLATELET
Abs Immature Granulocytes: 0.11 10*3/uL — ABNORMAL HIGH (ref 0.00–0.07)
Basophils Absolute: 0.1 10*3/uL (ref 0.0–0.1)
Basophils Relative: 0 %
Eosinophils Absolute: 0.1 10*3/uL (ref 0.0–0.5)
Eosinophils Relative: 1 %
HCT: 43.5 % (ref 39.0–52.0)
Hemoglobin: 15 g/dL (ref 13.0–17.0)
Immature Granulocytes: 1 %
Lymphocytes Relative: 7 %
Lymphs Abs: 1.3 10*3/uL (ref 0.7–4.0)
MCH: 29.4 pg (ref 26.0–34.0)
MCHC: 34.5 g/dL (ref 30.0–36.0)
MCV: 85.3 fL (ref 80.0–100.0)
Monocytes Absolute: 0.9 10*3/uL (ref 0.1–1.0)
Monocytes Relative: 4 %
Neutro Abs: 17.6 10*3/uL — ABNORMAL HIGH (ref 1.7–7.7)
Neutrophils Relative %: 87 %
Platelets: 318 10*3/uL (ref 150–400)
RBC: 5.1 MIL/uL (ref 4.22–5.81)
RDW: 12 % (ref 11.5–15.5)
WBC: 20.1 10*3/uL — ABNORMAL HIGH (ref 4.0–10.5)
nRBC: 0 % (ref 0.0–0.2)

## 2020-03-05 MED ORDER — ASPIRIN EC 325 MG PO TBEC
325.0000 mg | DELAYED_RELEASE_TABLET | Freq: Once | ORAL | Status: AC
  Administered 2020-03-05: 19:00:00 325 mg via ORAL
  Filled 2020-03-05: qty 1

## 2020-03-05 MED ORDER — ONDANSETRON HCL 4 MG PO TABS: 4.0000 mg | ORAL_TABLET | Freq: Three times a day (TID) | ORAL | 0 refills | Status: DC | PRN

## 2020-03-05 MED ORDER — AMOXICILLIN-POT CLAVULANATE 875-125 MG PO TABS: 1.0000 | ORAL_TABLET | Freq: Two times a day (BID) | ORAL | 0 refills | Status: AC

## 2020-03-05 MED ORDER — AZITHROMYCIN 250 MG PO TABS: 250.0000 mg | ORAL_TABLET | Freq: Every day | ORAL | 0 refills | Status: AC

## 2020-03-05 MED ORDER — SODIUM CHLORIDE 0.9 % IV BOLUS
1000.0000 mL | Freq: Once | INTRAVENOUS | Status: AC
  Administered 2020-03-05: 1000 mL via INTRAVENOUS

## 2020-03-05 MED ORDER — ONDANSETRON HCL 4 MG/2ML IJ SOLN
4.0000 mg | Freq: Once | INTRAMUSCULAR | Status: AC
  Administered 2020-03-05: 4 mg via INTRAVENOUS
  Filled 2020-03-05: qty 2

## 2020-03-05 MED ORDER — SODIUM CHLORIDE 0.9 % IV SOLN
3.0000 g | Freq: Once | INTRAVENOUS | Status: AC
  Administered 2020-03-05: 18:00:00 3 g via INTRAVENOUS
  Filled 2020-03-05: qty 3

## 2020-03-05 NOTE — ED Notes (Signed)
Pt verbalizes understanding of d/c instructions. Prescriptions reviewed with patient. Pt ambulatory at d/c with all belongings and with family.   

## 2020-03-05 NOTE — Consult Note (Addendum)
Medical Consultation   Anthony Robles  ENM:[id number]  DOB: 05-Jul-1990  DOA: 03/04/2020  PCP: Patient, No Pcp Per   Requesting physician: Berle Mull  Reason for consultation: Aspiration   History of Present Illness: Anthony Robles is an 29 y.o. male with PMH of appendicitis in past.  Pt had COVID-19 earlier this month, that improved and he was feeling better (and had re-tested negative for virus on 12/16).  Last evening, pt was watching movie and drinking hot chocolate when he started to laugh and inhaled the hot chocolate.  Immediate nausea, cough, post-tussive emesis, and severe SOB.  Symptoms have been persistent and constant since that time.  Cough productive of white like substance.  No CP, abd pain, fevers, chills, diarrhea.  No sick contacts.  No h/o immunocompromise.    Review of Systems:  ROS As per HPI otherwise 10 point review of systems negative.     Past Medical History: Past Medical History:  Diagnosis Date   Appendicitis    COVID-19     Past Surgical History: Past Surgical History:  Procedure Laterality Date   APPENDECTOMY     LAPAROSCOPIC APPENDECTOMY N/A 03/23/2014   Procedure: APPENDECTOMY LAPAROSCOPIC;  Surgeon: Manus Rudd, MD;  Location: [facility] OR;  Service: General;  Laterality: N/A;   TYMPANOSTOMY TUBE PLACEMENT     [age] y/o    WISDOM TOOTH EXTRACTION     29 y/o     Allergies:  No Known Allergies   Social History:  reports that he has never smoked. He does not have any smokeless tobacco history on file. He reports current alcohol use. He reports that he does not use drugs.   Family History: Family History  Problem Relation Age of Onset   Immunocompromised Neg Hx     No reported sick contacts.   Physical Exam: Vitals:   03/05/20 1445 03/05/20 1600 03/05/20 1814 03/05/20 1830  BP: 122/90 (!) 161/127  128/88  Pulse: (!) 112 (!) 110  100  Resp: 20 (!) 22  15  Temp:      TempSrc:      SpO2: 96% 96%  96% 96%    Constitutional: Alert and awake, oriented x3, not in any acute distress. Eyes: PERLA, EOMI, irises appear normal, anicteric sclera,  ENMT: external ears and nose appear normal            Lips appears normal, oropharynx mucosa, tongue, posterior pharynx appear normal  Neck: neck appears normal, no masses, normal ROM, no thyromegaly, no JVD  CVS: S1-S2 clear, no murmur rubs or gallops, no LE edema, normal pedal pulses  Respiratory:  clear to auscultation bilaterally, no wheezing, rales or rhonchi. Respiratory effort normal. No accessory muscle use.  Abdomen: soft nontender, nondistended, normal bowel sounds, no hepatosplenomegaly, no hernias  Musculoskeletal: : no cyanosis, clubbing or edema noted bilaterally Neuro: Cranial nerves II-XII intact, strength, sensation, reflexes Psych: judgement and insight appear normal, stable mood and affect, mental status Skin: no rashes or lesions or ulcers, no induration or nodules   Data reviewed:  I have personally reviewed following labs and imaging studies Labs:  CBC: Recent Labs  Lab 03/04/20 2324 03/05/20 1755  WBC 11.8* 20.1*  NEUTROABS  --  17.6*  HGB 15.3 15.0  HCT 41.7 43.5  MCV 82.9 85.3  PLT 355 318    Basic Metabolic Panel: Recent Labs  Lab 03/04/20 2324  NA 137  K 3.6  CL 99  CO2 21*  GLUCOSE 125*  BUN 14  CREATININE 0.83  CALCIUM 9.4   GFR CrCl cannot be calculated (Unknown ideal weight.). Liver Function Tests: No results for input(s): AST, ALT, ALKPHOS, BILITOT, PROT, ALBUMIN in the last 168 hours. No results for input(s): LIPASE, AMYLASE in the last 168 hours. No results for input(s): AMMONIA in the last 168 hours. Coagulation profile No results for input(s): INR, PROTIME in the last 168 hours.  Cardiac Enzymes: No results for input(s): CKTOTAL, CKMB, CKMBINDEX, TROPONINI in the last 168 hours. BNP: Invalid input(s): POCBNP CBG: No results for input(s): GLUCAP in the last 168 hours. D-Dimer No  results for input(s): DDIMER in the last 72 hours. Hgb A1c No results for input(s): HGBA1C in the last 72 hours. Lipid Profile No results for input(s): CHOL, HDL, LDLCALC, TRIG, CHOLHDL, LDLDIRECT in the last 72 hours. Thyroid function studies No results for input(s): TSH, T4TOTAL, T3FREE, THYROIDAB in the last 72 hours.  Invalid input(s): FREET3 Anemia work up No results for input(s): VITAMINB12, FOLATE, FERRITIN, TIBC, IRON, RETICCTPCT in the last 72 hours. Urinalysis    Component Value Date/Time   COLORURINE YELLOW 01/23/2016 0913   APPEARANCEUR CLOUDY (A) 01/23/2016 0913   LABSPEC 1.024 01/23/2016 0913   PHURINE 7.0 01/23/2016 0913   GLUCOSEU NEGATIVE 01/23/2016 0913   HGBUR NEGATIVE 01/23/2016 0913   BILIRUBINUR NEGATIVE 01/23/2016 0913   KETONESUR NEGATIVE 01/23/2016 0913   PROTEINUR NEGATIVE 01/23/2016 0913   UROBILINOGEN 0.2 03/23/2014 1344   NITRITE NEGATIVE 01/23/2016 0913   LEUKOCYTESUR NEGATIVE 01/23/2016 0913     Sepsis Labs Invalid input(s): PROCALCITONIN,  WBC,  LACTICIDVEN Microbiology Recent Results (from the past 240 hour(s))  Resp Panel by RT-PCR (Flu A&B, Covid) Nasopharyngeal Swab     Status: None   Collection Time: 03/05/20  5:55 PM   Specimen: Nasopharyngeal Swab; Nasopharyngeal(NP) swabs in vial transport medium  Result Value Ref Range Status   SARS Coronavirus 2 by RT PCR NEGATIVE NEGATIVE Final    Comment: (NOTE) SARS-CoV-2 target nucleic acids are NOT DETECTED.  The SARS-CoV-2 RNA is generally detectable in upper respiratory specimens during the acute phase of infection. The lowest concentration of SARS-CoV-2 viral copies this assay can detect is 138 copies/mL. A negative result does not preclude SARS-Cov-2 infection and should not be used as the sole basis for treatment or other patient management decisions. A negative result may occur with  improper specimen collection/handling, submission of specimen other than nasopharyngeal swab,  presence of viral mutation(s) within the areas targeted by this assay, and inadequate number of viral copies(<138 copies/mL). A negative result must be combined with clinical observations, patient history, and epidemiological information. The expected result is Negative.  Fact Sheet for Patients:  BloggerCourse.com  Fact Sheet for Healthcare Providers:  SeriousBroker.it  This test is no t yet approved or cleared by the [place] FDA and  has been authorized for detection and/or diagnosis of SARS-CoV-2 by FDA under an Emergency Use Authorization (EUA). This EUA will remain  in effect (meaning this test can be used) for the duration of the COVID-19 declaration under Section 564(b)(1) of the Act, 21 U.S.C.section 360bbb-3(b)(1), unless the authorization is terminated  or revoked sooner.       Influenza A by PCR NEGATIVE NEGATIVE Final   Influenza B by PCR NEGATIVE NEGATIVE Final    Comment: (NOTE) The Xpert Xpress SARS-CoV-2/FLU/RSV plus assay is intended as an aid in the diagnosis of influenza from Nasopharyngeal swab specimens and should  not be used as a sole basis for treatment. Nasal washings and aspirates are unacceptable for Xpert Xpress SARS-CoV-2/FLU/RSV testing.  Fact Sheet for Patients: BloggerCourse.com  Fact Sheet for Healthcare Providers: SeriousBroker.it  This test is not yet approved or cleared by the [place] FDA and has been authorized for detection and/or diagnosis of SARS-CoV-2 by FDA under an Emergency Use Authorization (EUA). This EUA will remain in effect (meaning this test can be used) for the duration of the COVID-19 declaration under Section 564(b)(1) of the Act, 21 U.S.C. section 360bbb-3(b)(1), unless the authorization is terminated or revoked.  Performed at [facility], [address] [place], [place] [address]         Inpatient Medications:   Scheduled Meds: Continuous Infusions:   Radiological Exams on Admission: DG Chest 2 View  Result Date: 03/05/2020 CLINICAL DATA:  Aspiration EXAM: CHEST - 2 VIEW COMPARISON:  03/04/2020 FINDINGS: The heart size and mediastinal contours are within normal limits. Slightly low lung volumes. Interval development of streaky left basilar opacity. No pleural effusion or pneumothorax. The visualized skeletal structures are unremarkable. IMPRESSION: Interval development of streaky left basilar opacity, which may reflect atelectasis versus developing infiltrate/aspiration. Electronically Signed   By: Duanne Guess D.O.   On: 03/05/2020 17:00   DG Chest 2 View  Result Date: 03/04/2020 CLINICAL DATA:  Shortness of breath, potential aspiration EXAM: CHEST - 2 VIEW COMPARISON:  None. FINDINGS: No consolidation, features of edema, pneumothorax, or effusion. Pulmonary vascularity is normally distributed. The cardiomediastinal contours are unremarkable. Slight exaggeration of the lower thoracic kyphosis. No acute osseous or soft tissue abnormality. IMPRESSION: No acute cardiopulmonary abnormality. Electronically Signed   By: Kreg Shropshire M.D.   On: 03/04/2020 23:42    Impression/Recommendations Principal Problem:   Aspiration pneumonia (HCC)  1. Aspiration PNA - 1. No O2 requirement, PORT score is 28 at this time 2. Pt low risk and outpt treatment attempt is felt to be reasonable. 3. Got 1 dose unasyn in ED 4. Rec outpt treatment with amino penicillin + azithromycin. 5. Return precautions 6. COVID-19 testing is negative today 1. Had COVID-19 earlier this month, but symptoms resolved 2. And tested negative as outpt earlier this month as well 3. Appears to have rapidly cleared virus   Thank you for this consultation.  Our [facility] hospitalist team will follow the patient with you.   Time Spent: 40 min  Damiana Berrian M. D.O. [facility] 03/05/2020, 7:28  PM

## 2020-03-05 NOTE — ED Notes (Signed)
Pt ambulatory in room on pulse ox independently. Pt SpO2 remained 96% and above. Denies any sob or dizziness. Pt reports increased activity causes increased coughing. EDP notified.

## 2020-03-05 NOTE — Discharge Instructions (Signed)
Seen here for concerns of aspirating.  Imaging concerning for aspiration pneumonia.  I started you on 2 antibiotics which you will take for the next 5 days, please take as prescribed.  I am also given you Zofran for nausea please take as needed.  You may take ibuprofen for pain control and Tylenol for fever control please follow dosing on the back of bottle.  I recommend a bland diet  I recommend following up with your PCP, urgent care or another emergency department next 6 days for repeat x-ray of your chest  I would like to come back to emergency department if you have an uncontrolled fever despite using Tylenol, become more lethargic, have worsening cough, shortness of breath, inability to tolerate food or water.  Chest pain, severe abdominal pain, uncontrolled nausea, vomiting, diarrhea the symptoms require further evaluation management.

## 2020-03-05 NOTE — ED Provider Notes (Signed)
[facility] EMERGENCY DEPARTMENT Provider Note   CSN: [id number] Arrival date & time: 03/04/20  2305     History Chief Complaint  Patient presents with  . Aspiration    Anthony Robles is a 29 y.o. male.  HPI   Patient with no significant medical history presents to the emergency department with chief complaint of cough, vomiting and concerns of aspirating hot chocolate.  Patient states last night he was watching a movie and drinking hot chocolate when he started to laugh and inhaled hot chocolate.  He states immediately he became nauseous, started vomiting, cough, felt very short of breath.  He states he has been unable to stop coughing, cough is productive, white like substance coming from his throat.  He states he has been nauseous and unable to hold anything down.  He states taking a deep breath makes it much worse and makes him cough, he denies chest pain, abdominal pain, fevers, chills, constipation or diarrhea.  He denies any alleviating factors.  Denies any recent sick contacts.  Is not immunocompromise.  Past Medical History:  Diagnosis Date  . Appendicitis   . COVID-19     Patient Active Problem List   Diagnosis Date Noted  . Aspiration pneumonia (HCC) 03/05/2020  . Acute appendicitis 03/23/2014  . Appendicitis 03/23/2014    Past Surgical History:  Procedure Laterality Date  . APPENDECTOMY    . LAPAROSCOPIC APPENDECTOMY N/A 03/23/2014   Procedure: APPENDECTOMY LAPAROSCOPIC;  Surgeon: Manus Rudd, MD;  Location: [facility] OR;  Service: General;  Laterality: N/A;  . TYMPANOSTOMY TUBE PLACEMENT     [age] y/o   . WISDOM TOOTH EXTRACTION     29 y/o       Family History  Problem Relation Age of Onset  . Immunocompromised Neg Hx     Social History   Tobacco Use  . Smoking status: Never Smoker  Substance Use Topics  . Alcohol use: Yes    Comment: occ  . Drug use: No    Home Medications Prior to Admission medications   Medication Sig Start Date  End Date Taking? Authorizing Provider  Multiple Vitamin (MULTIVITAMIN WITH MINERALS) TABS tablet Take 1 tablet by mouth daily.   Yes [provider]  amoxicillin-clavulanate (AUGMENTIN) 875-125 MG tablet Take 1 tablet by mouth every 12 (twelve) hours for 5 days. 03/05/20 03/10/20  Carroll Sage, PA-C  azithromycin (ZITHROMAX) 250 MG tablet Take 1 tablet (250 mg total) by mouth daily for 5 days. 03/05/20 03/10/20  Carroll Sage, PA-C  dicyclomine (BENTYL) 20 MG tablet Take 1 tablet (20 mg total) by mouth 2 (two) times daily as needed for spasms (diarrhea and abdominal cramping). Patient not taking: Reported on 03/05/2020 01/23/16   Street, [place], PA-C  ondansetron (ZOFRAN ODT) 8 MG disintegrating tablet Take 1 tablet (8 mg total) by mouth every 8 (eight) hours as needed for nausea or vomiting. Patient not taking: No sig reported 01/23/16   Street, [place], PA-C  ondansetron (ZOFRAN) 4 MG tablet Take 1 tablet (4 mg total) by mouth every 8 (eight) hours as needed for nausea or vomiting. 03/05/20   Carroll Sage, PA-C    Allergies    Patient has no known allergies.  Review of Systems   Review of Systems  Constitutional: Negative for chills and fever.  HENT: Positive for sore throat. Negative for congestion.   Eyes: Negative for visual disturbance.  Respiratory: Positive for cough and shortness of breath.   Cardiovascular: Negative for chest  pain.  Gastrointestinal: Positive for nausea and vomiting. Negative for abdominal pain, constipation and diarrhea.  Genitourinary: Negative for enuresis and flank pain.  Musculoskeletal: Negative for back pain.  Skin: Negative for rash.  Neurological: Positive for headaches. Negative for dizziness.  Hematological: Does not bruise/bleed easily.    Physical Exam Updated Vital Signs BP 109/69   Pulse 95   Temp 98.7 F (37.1 C) (Oral)   Resp 13   SpO2 97%   Physical Exam Vitals and nursing note reviewed.   Constitutional:      General: He is in acute distress.     Appearance: He is not ill-appearing.  HENT:     Head: Normocephalic and atraumatic.     Nose: No congestion.     Mouth/Throat:     Mouth: Mucous membranes are moist.     Pharynx: Oropharynx is clear. No oropharyngeal exudate or posterior oropharyngeal erythema.     Comments: Tongue uvula both midline, there is no exudates or erythema noted.  Patient controlling her own secretions with difficulty. Eyes:     Conjunctiva/sclera: Conjunctivae normal.  Cardiovascular:     Rate and Rhythm: Normal rate and regular rhythm.     Pulses: Normal pulses.     Heart sounds: No murmur heard. No friction rub. No gallop.   Pulmonary:     Effort: No respiratory distress.     Breath sounds: Rhonchi present. No wheezing or rales.     Comments: Patient has good rise and fall during respiration, currently satting around 95 room air. He had noted Rales in his left lower lobe, with some chest tightness.  No rhonchi, wheezing, stridor present. Abdominal:     Palpations: Abdomen is soft.     Tenderness: There is no abdominal tenderness.  Musculoskeletal:     Right lower leg: No edema.     Left lower leg: No edema.  Skin:    General: Skin is warm and dry.  Neurological:     Mental Status: He is alert.  Psychiatric:        Mood and Affect: Mood normal.     ED Results / Procedures / Treatments   Labs (all labs ordered are listed, but only abnormal results are displayed) Labs Reviewed  BASIC METABOLIC PANEL - Abnormal; Notable for the following components:      Result Value   CO2 21 (*)    Glucose, Bld 125 (*)    Anion gap 17 (*)    All other components within normal limits  CBC - Abnormal; Notable for the following components:   WBC 11.8 (*)    MCHC 36.7 (*)    All other components within normal limits  CBC WITH DIFFERENTIAL/PLATELET - Abnormal; Notable for the following components:   WBC 20.1 (*)    Neutro Abs 17.6 (*)    Abs  Immature Granulocytes 0.11 (*)    All other components within normal limits  RESP PANEL BY RT-PCR (FLU A&B, COVID) [id number]  CULTURE, BLOOD (ROUTINE X 2)  CULTURE, BLOOD (ROUTINE X 2)    EKG EKG Interpretation  Date/Time:  Wednesday March 05 2020 19:23:21 EST Ventricular Rate:  104 PR Interval:    QRS Duration: 95 QT Interval:  340 QTC Calculation: 448 R Axis:   92 Text Interpretation: Sinus tachycardia Borderline right axis deviation Confirmed by Kristine Royal [phone]) on 03/05/2020 7:25:04 PM   Radiology DG Chest 2 View  Result Date: 03/05/2020 CLINICAL DATA:  Aspiration EXAM: CHEST - 2 VIEW COMPARISON:  03/04/2020 FINDINGS: The heart size and mediastinal contours are within normal limits. Slightly low lung volumes. Interval development of streaky left basilar opacity. No pleural effusion or pneumothorax. The visualized skeletal structures are unremarkable. IMPRESSION: Interval development of streaky left basilar opacity, which may reflect atelectasis versus developing infiltrate/aspiration. Electronically Signed   By: Duanne GuessNicholas  Plundo D.O.   On: 03/05/2020 17:00   DG Chest 2 View  Result Date: 03/04/2020 CLINICAL DATA:  Shortness of breath, potential aspiration EXAM: CHEST - 2 VIEW COMPARISON:  None. FINDINGS: No consolidation, features of edema, pneumothorax, or effusion. Pulmonary vascularity is normally distributed. The cardiomediastinal contours are unremarkable. Slight exaggeration of the lower thoracic kyphosis. No acute osseous or soft tissue abnormality. IMPRESSION: No acute cardiopulmonary abnormality. Electronically Signed   By: Kreg ShropshirePrice  DeHay M.D.   On: 03/04/2020 23:42    Procedures Procedures (including critical care time)  Medications Ordered in ED Medications  sodium chloride 0.9 % bolus 1,000 mL (0 mLs Intravenous Stopped 03/05/20 2013)  ondansetron (ZOFRAN) injection 4 mg (4 mg Intravenous Given 03/05/20 1754)  Ampicillin-Sulbactam (UNASYN) 3 g in sodium  chloride 0.9 % 100 mL IVPB (0 g Intravenous Stopped 03/05/20 1831)  aspirin EC tablet 325 mg (325 mg Oral Given 03/05/20 1835)    ED Course  I have reviewed the triage vital signs and the nursing notes.  Pertinent labs & imaging results that were available during my care of the patient were reviewed by me and considered in my medical decision making (see chart for details).    MDM Rules/Calculators/A&P                          Patient presents with concerns of aspirating.  He is alert, appeared to be in acute distress, vital signs sent for tachycardia.  Patient's labs were performed 12 hours ago, will repeat and obtain additional x-ray for further evaluation.  Will provide patient with fluids, Zofran and reevaluate.  Patient's x-ray is concerning for aspiration pneumonia will start him on Unasyn and consult with hospitalist for further recommendations.  Spoke with Dr. Julian ReilGardner of the hospitalist team and he states he will come down and evaluate the patient.  After assessing the patient he feels patient could be safely discharged home with outpatient regimen for aspirin pneumonia.  will start him on amino penicillin plus azithromycin and encourage close follow-up.  Patient was reevaluated after time with fluids, Zofran, pain medications and antibiotics.  He states he states he is feeling better, he ambulated on room air maintained 96%.  He is tolerating p.o.  Patient's tachycardia has improved with fluids.  CBC shows leukocytosis of 20.1, increased  12 hours ago from 11.8.  No anemia present.  BMP negative for electrolyte abnormalities, slight metabolic acidosis with a CO2 of 21, hyperglycemia 125, and gap of 17.  Second chest x-ray shows development of streaky-like left basilar opacity which may reflect atelectasis versus developing infiltrate/aspiration pneumonia.  I have low suspicion patient suffering from a cardiac abnormality causing patient's tachycardia as patient denies chest pain, there  is no signs of hypoperfusion fluid overload on my exam.  I suspect tachycardia may be multifactorial due to pain from chronic coughing as well as possible aspiration pneumonia.  Low suspicion for PE as patient has low risk factors, there is no signs of pedal edema, patient states this all started after he aspirated hot chocolate.  I find history is more consistent with aspiration pneumonia versus PE.  Low suspicion  for intra-abdominal abnormality requiring immediate intervention as patient's abdomen soft nontender to palpation, he was tolerating p.o. without difficulty.  I suspect patient's symptoms are secondary to aspirin pneumonia as he stated he inhale hot chocolate.  Which most likely turn into pneumonia.  Will provide him with outpatient antibiotics, Zofran for nausea and recommend close follow-up with PCP.  Vital signs have remained stable, no indication for hospital admission.  Patient discussed with attending and they agreed with assessment and plan.  Patient given at home care as well strict return precautions.  Patient verbalized that they understood agreed to said plan.    Final Clinical Impression(s) / ED Diagnoses Final diagnoses:  Cough  Non-intractable vomiting with nausea, unspecified vomiting type    Rx / DC Orders ED Discharge Orders         Ordered    amoxicillin-clavulanate (AUGMENTIN) 875-125 MG tablet  Every 12 hours        03/05/20 2002    azithromycin (ZITHROMAX) 250 MG tablet  Daily        03/05/20 2002    ondansetron (ZOFRAN) 4 MG tablet  Every 8 hours PRN        03/05/20 2002           Barnie Del 03/05/20 2057    Wynetta Fines, MD 03/05/20 [phone]

## 2020-03-10 LAB — CULTURE, BLOOD (ROUTINE X 2)
Culture: NO GROWTH
Culture: NO GROWTH
Special Requests: ADEQUATE

## 2020-03-11 ENCOUNTER — Ambulatory Visit: Payer: Self-pay

## 2020-03-11 ENCOUNTER — Emergency Department
Admission: RE | Admit: 2020-03-11 | Discharge: 2020-03-11 | Disposition: A | Discharge status: Home / Self Care | Payer: BC Managed Care – PPO | Source: Ambulatory Visit | Attending: Internal Medicine | Admitting: Internal Medicine

## 2020-03-11 ENCOUNTER — Other Ambulatory Visit: Payer: Self-pay

## 2020-03-11 VITALS — BP 121/84 | HR 89 | Temp 98.3°F | Resp 16

## 2020-03-11 DIAGNOSIS — J690 Pneumonitis due to inhalation of food and vomit: Secondary | ICD-10-CM | POA: Diagnosis not present

## 2020-03-11 NOTE — Discharge Instructions (Signed)
You are doing better from your physical exam and your symptom description. We will repeat your blood work If you have any worsening symptoms please return to the urgent care to be reevaluated.

## 2020-03-11 NOTE — ED Triage Notes (Signed)
Patient presents to Urgent Care with complaints of needing a follow-up chest x-ray for aspiration pneumonia since he was diagnosed last week . Patient reports he feels better, still has a slightly productive cough. Pt has completed the course of abx.

## 2020-03-14 LAB — POCT CBC W AUTO DIFF (K'VILLE URGENT CARE)

## 2020-03-16 NOTE — ED Provider Notes (Signed)
[facility]    CSN: [id number] Arrival date & time: 03/11/20  1251      History   Chief Complaint Chief Complaint  Patient presents with  . Appointment  . Follow-up    CXR for PNA    HPI Anthony Robles is a 30 y.o. male recently treated for aspiration pneumonia comes to the urgent care for follow-up.  Patient was treated in the emergency department.  Is completed the course of antibiotics and feels better.  He has a slight cough which sounds wet but produces minimal sputum.  He denies any fever or chills.  He is here in the urgent care for follow-up chest x-ray.  HPI  Past Medical History:  Diagnosis Date  . Appendicitis   . COVID-19     Patient Active Problem List   Diagnosis Date Noted  . Aspiration pneumonia (HCC) 03/05/2020  . Acute appendicitis 03/23/2014  . Appendicitis 03/23/2014    Past Surgical History:  Procedure Laterality Date  . APPENDECTOMY    . LAPAROSCOPIC APPENDECTOMY N/A 03/23/2014   Procedure: APPENDECTOMY LAPAROSCOPIC;  Surgeon: Manus Rudd, MD;  Location: [facility] OR;  Service: General;  Laterality: N/A;  . TYMPANOSTOMY TUBE PLACEMENT     [age] y/o   . WISDOM TOOTH EXTRACTION     30 y/o       Home Medications    Prior to Admission medications   Medication Sig Start Date End Date Taking? Authorizing Provider  Multiple Vitamin (MULTIVITAMIN WITH MINERALS) TABS tablet Take 1 tablet by mouth daily.    [provider]  ondansetron (ZOFRAN) 4 MG tablet Take 1 tablet (4 mg total) by mouth every 8 (eight) hours as needed for nausea or vomiting. 03/05/20   Carroll Sage, PA-C  dicyclomine (BENTYL) 20 MG tablet Take 1 tablet (20 mg total) by mouth 2 (two) times daily as needed for spasms (diarrhea and abdominal cramping). Patient not taking: No sig reported 01/23/16 03/11/20  Street, [place], PA-C    Family History Family History  Problem Relation Age of Onset  . Asthma Mother   . Heart failure Father   . Immunocompromised  Neg Hx     Social History Social History   Tobacco Use  . Smoking status: Never Smoker  . Smokeless tobacco: Never Used  Substance Use Topics  . Alcohol use: Yes    Comment: occ  . Drug use: No     Allergies   Patient has no known allergies.   Review of Systems Review of Systems  Constitutional: Negative for chills and fever.  HENT: Positive for congestion.   Respiratory: Positive for cough. Negative for chest tightness, shortness of breath and wheezing.   Cardiovascular: Negative for chest pain.  Gastrointestinal: Negative for nausea.     Physical Exam Triage Vital Signs ED Triage Vitals  Enc Vitals Group     BP 03/11/20 1329 121/84     Pulse Rate 03/11/20 1329 89     Resp 03/11/20 1329 16     Temp 03/11/20 1329 98.3 F (36.8 C)     Temp Source 03/11/20 1329 Oral     SpO2 03/11/20 1329 96 %     Weight --      Height --      Head Circumference --      Peak Flow --      Pain Score 03/11/20 1328 0     Pain Loc --      Pain Edu? --  Excl. in GC? --    No data found.  Updated Vital Signs BP 121/84 (BP Location: Left Arm)   Pulse 89   Temp 98.3 F (36.8 C) (Oral)   Resp 16   SpO2 96%   Visual Acuity Right Eye Distance:   Left Eye Distance:   Bilateral Distance:    Right Eye Near:   Left Eye Near:    Bilateral Near:     Physical Exam Vitals and nursing note reviewed.  Constitutional:      General: He is not in acute distress.    Appearance: He is not ill-appearing.  Cardiovascular:     Rate and Rhythm: Normal rate and regular rhythm.     Pulses: Normal pulses.     Heart sounds: Normal heart sounds.  Pulmonary:     Effort: Pulmonary effort is normal.     Breath sounds: Normal breath sounds. No wheezing or rhonchi.  Neurological:     Mental Status: He is alert.      UC Treatments / Results  Labs (all labs ordered are listed, but only abnormal results are displayed) Labs Reviewed  POCT CBC W AUTO DIFF ([facility])     EKG   Radiology No results found.  Procedures Procedures (including critical care time)  Medications Ordered in UC Medications - No data to display  Initial Impression / Assessment and Plan / UC Course  I have reviewed the triage vital signs and the nursing notes.  Pertinent labs & imaging results that were available during my care of the patient were reviewed by me and considered in my medical decision making (see chart for details).     1.  Aspiration pneumonia: Completed a course of Augmentin and azithromycin I reassured patient and explained to the patient that x-ray is not indicated at this time because he is clinically better.  Chest x-ray changes from the pneumonia/pneumonitis may be persistent for up to 6 weeks.  Hence it is too soon to have a follow-up chest x-ray.  His white cell count was 20,000 last week.  Repeat CBC shows WBC of 5.7 with normal differential. Patient is advised to follow-up with primary care physician. Follow-up chest x-ray may be indicated in the next 4 weeks if needed Final Clinical Impressions(s) / UC Diagnoses   Final diagnoses:  Aspiration pneumonia of right lower lobe due to vomit [facility])     Discharge Instructions     You are doing better from your physical exam and your symptom description. We will repeat your blood work If you have any worsening symptoms please return to the urgent care to be reevaluated.    ED Prescriptions    None     PDMP not reviewed this encounter.   Merrilee Jansky, MD 03/16/20 1022

## 2020-05-28 DIAGNOSIS — Z0000 Encounter for general adult medical examination without abnormal findings: Secondary | ICD-10-CM | POA: Diagnosis not present

## 2020-05-28 DIAGNOSIS — Z131 Encounter for screening for diabetes mellitus: Secondary | ICD-10-CM | POA: Diagnosis not present

## 2020-05-28 DIAGNOSIS — Z13220 Encounter for screening for lipoid disorders: Secondary | ICD-10-CM | POA: Diagnosis not present

## 2020-05-28 DIAGNOSIS — Z23 Encounter for immunization: Secondary | ICD-10-CM | POA: Diagnosis not present

## 2020-08-22 ENCOUNTER — Other Ambulatory Visit: Payer: Self-pay

## 2020-08-22 DIAGNOSIS — K529 Noninfective gastroenteritis and colitis, unspecified: Secondary | ICD-10-CM | POA: Insufficient documentation

## 2020-08-22 DIAGNOSIS — Z8616 Personal history of COVID-19: Secondary | ICD-10-CM | POA: Diagnosis not present

## 2020-08-22 DIAGNOSIS — R112 Nausea with vomiting, unspecified: Secondary | ICD-10-CM | POA: Diagnosis not present

## 2020-08-22 DIAGNOSIS — R000 Tachycardia, unspecified: Secondary | ICD-10-CM | POA: Diagnosis not present

## 2020-08-22 MED ORDER — ONDANSETRON HCL 4 MG/2ML IJ SOLN
4.0000 mg | Freq: Once | INTRAMUSCULAR | Status: AC | PRN
  Administered 2020-08-23: 4 mg via INTRAVENOUS
  Filled 2020-08-22: qty 2

## 2020-08-22 NOTE — ED Triage Notes (Signed)
Pt presents to ER from home reports he started to have some abdominal pain around 4pm. Reports around 2pm he ate some [employer]. At 6pm pt reports he developed nausea, vomiting and diarrhea, reports he noticed some blood with emesis, feel weak. Denies fever at home. Pt actively vomiting in triage, pale, diaphoretic.

## 2020-08-23 ENCOUNTER — Emergency Department
Admission: EM | Admit: 2020-08-23 | Discharge: 2020-08-23 | Disposition: A | Discharge status: Home / Self Care | Payer: BC Managed Care – PPO | Attending: Emergency Medicine | Admitting: Emergency Medicine

## 2020-08-23 DIAGNOSIS — K529 Noninfective gastroenteritis and colitis, unspecified: Secondary | ICD-10-CM | POA: Diagnosis not present

## 2020-08-23 DIAGNOSIS — R000 Tachycardia, unspecified: Secondary | ICD-10-CM | POA: Diagnosis not present

## 2020-08-23 LAB — CBC
HCT: 51.4 % (ref 39.0–52.0)
Hemoglobin: 18.4 g/dL — ABNORMAL HIGH (ref 13.0–17.0)
MCH: 29.9 pg (ref 26.0–34.0)
MCHC: 35.8 g/dL (ref 30.0–36.0)
MCV: 83.4 fL (ref 80.0–100.0)
Platelets: 233 10*3/uL (ref 150–400)
RBC: 6.16 MIL/uL — ABNORMAL HIGH (ref 4.22–5.81)
RDW: 11.9 % (ref 11.5–15.5)
WBC: 15.7 10*3/uL — ABNORMAL HIGH (ref 4.0–10.5)
nRBC: 0 % (ref 0.0–0.2)

## 2020-08-23 LAB — C DIFFICILE QUICK SCREEN W PCR REFLEX
C Diff antigen: NEGATIVE
C Diff interpretation: NOT DETECTED
C Diff toxin: NEGATIVE

## 2020-08-23 LAB — COMPREHENSIVE METABOLIC PANEL
ALT: 27 U/L (ref 0–44)
AST: 23 U/L (ref 15–41)
Albumin: 5.4 g/dL — ABNORMAL HIGH (ref 3.5–5.0)
Alkaline Phosphatase: 87 U/L (ref 38–126)
Anion gap: 13 (ref 5–15)
BUN: 20 mg/dL (ref 6–20)
CO2: 20 mmol/L — ABNORMAL LOW (ref 22–32)
Calcium: 9.9 mg/dL (ref 8.9–10.3)
Chloride: 104 mmol/L (ref 98–111)
Creatinine, Ser: 1.08 mg/dL (ref 0.61–1.24)
GFR, Estimated: >60 mL/min (ref >60)
Glucose, Bld: 157 mg/dL — ABNORMAL HIGH (ref 70–99)
Potassium: 4.8 mmol/L (ref 3.5–5.1)
Sodium: 137 mmol/L (ref 135–145)
Total Bilirubin: 1.9 mg/dL — ABNORMAL HIGH (ref 0.3–1.2)
Total Protein: 8.7 g/dL — ABNORMAL HIGH (ref 6.5–8.1)

## 2020-08-23 LAB — LIPASE, BLOOD: Lipase: 36 U/L (ref 11–51)

## 2020-08-23 LAB — MAGNESIUM: Magnesium: 2 mg/dL (ref 1.7–2.4)

## 2020-08-23 MED ORDER — DROPERIDOL 2.5 MG/ML IJ SOLN
1.2500 mg | Freq: Once | INTRAMUSCULAR | Status: AC
  Administered 2020-08-23: 02:00:00 1.25 mg via INTRAVENOUS
  Filled 2020-08-23: qty 2

## 2020-08-23 MED ORDER — ONDANSETRON 4 MG PO TBDP: ORAL_TABLET | ORAL | 0 refills | Status: AC

## 2020-08-23 MED ORDER — LACTATED RINGERS IV BOLUS
2000.0000 mL | Freq: Once | INTRAVENOUS | Status: AC
  Administered 2020-08-23: 01:00:00 2000 mL via INTRAVENOUS

## 2020-08-23 NOTE — ED Provider Notes (Signed)
[facility] Emergency Department Provider Note  ____________________________________________   Event Date/Time   First MD Initiated Contact with Patient 08/23/20 0011     (approximate)  I have reviewed the triage vital signs and the nursing notes.   HISTORY  Chief Complaint Abdominal Pain    HPI Anthony Robles is a 30 y.o. male who reports no chronic medical issues and presents for evaluation of cute onset and severe nausea, vomiting, diarrhea, and abdominal pain.  He said that it started around 7 PM.  He ate [employer] for lunch and wonders if perhaps he had something bad in his meal because the hamburger did not look quite right or fully cooked, and he started to feel symptoms that he associated with indigestion within a couple of hours but then he became acutely ill a few hours after that.  The symptoms all started more or less simultaneously with copious vomiting and watery diarrhea almost every 15 minutes for the last few hours.  He has had cramping abdominal pain that has improved but still occasional a few cramps.  The vomiting and diarrhea are severe and he feels dehydrated with generalized weakness and thirst in spite of his nausea.  He is currently not having any abdominal pain.  He has not had any recent fever.  He denies chest pain or shortness of breath.  His weakness is generalized and not associated with a particular extremity.  He denies drug or alcohol use.     Past Medical History:  Diagnosis Date   Appendicitis    COVID-19     Patient Active Problem List   Diagnosis Date Noted   Aspiration pneumonia (HCC) 03/05/2020   Acute appendicitis 03/23/2014   Appendicitis 03/23/2014    Past Surgical History:  Procedure Laterality Date   APPENDECTOMY     LAPAROSCOPIC APPENDECTOMY N/A 03/23/2014   Procedure: APPENDECTOMY LAPAROSCOPIC;  Surgeon: Manus Rudd, MD;  Location: [facility] OR;  Service: General;  Laterality: N/A;   TYMPANOSTOMY TUBE PLACEMENT      [age] y/o    WISDOM TOOTH EXTRACTION     30 y/o    Prior to Admission medications   Medication Sig Start Date End Date Taking? Authorizing Provider  ondansetron (ZOFRAN ODT) 4 MG disintegrating tablet Allow 1-2 tablets to dissolve in your mouth every 8 hours as needed for nausea/vomiting 08/23/20  Yes Loleta Rose, MD  dicyclomine (BENTYL) 20 MG tablet Take 1 tablet (20 mg total) by mouth 2 (two) times daily as needed for spasms (diarrhea and abdominal cramping). Patient not taking: No sig reported 01/23/16 03/11/20  Street, [place], [place]    Allergies Patient has no known allergies.  Family History  Problem Relation Age of Onset   Asthma Mother    Heart failure Father    Immunocompromised Neg Hx     Social History Social History   Tobacco Use   Smoking status: Never   Smokeless tobacco: Never  Substance Use Topics   Alcohol use: Yes    Comment: occ   Drug use: No    Review of Systems Constitutional: Positive for generalized weakness.  Negative for fever/chills. Eyes: No visual changes. ENT: No sore throat. Cardiovascular: Denies chest pain. Respiratory: Denies shortness of breath. Gastrointestinal: Positive for N/V/D as well as cramping abdominal pain Genitourinary: Negative for dysuria. Musculoskeletal: Negative for neck pain.  Negative for back pain. Integumentary: Negative for rash. Neurological: Negative for headaches, focal weakness or numbness.   ____________________________________________   PHYSICAL EXAM:  VITAL SIGNS:  ED Triage Vitals  Enc Vitals Group     BP 08/22/20 2353 117/85     Pulse Rate 08/22/20 2353 (!) 105     Resp 08/22/20 2353 (!) 24     Temp 08/22/20 2353 97.8 F (36.6 C)     Temp Source 08/22/20 2353 Oral     SpO2 08/22/20 2353 98 %     Weight 08/22/20 2357 96.2 kg (212 lb)     Height 08/22/20 2357 1.88 m (6\' 2" )     Head Circumference --      Peak Flow --      Pain Score 08/22/20 2357 9     Pain Loc --      Pain Edu? --       Excl. in GC? --     Constitutional: Alert and oriented.  Appears uncomfortable. Eyes: Conjunctivae are normal.  Head: Atraumatic. Nose: No congestion/rhinnorhea. Mouth/Throat: Dry mucous membranes. Neck: No stridor.  No meningeal signs.   Cardiovascular: Normal rate, regular rhythm. Good peripheral circulation. Respiratory: Normal respiratory effort.  No retractions. Gastrointestinal: Soft and nondistended.  He has no tenderness to palpation including in the epigastrium or right upper quadrant. Musculoskeletal: No lower extremity tenderness nor edema. No gross deformities of extremities. Neurologic:  Normal speech and language. No gross focal neurologic deficits are appreciated.  Skin:  Skin is warm, dry and intact. Psychiatric: Mood and affect are normal. Speech and behavior are normal.  ____________________________________________   LABS (all labs ordered are listed, but only abnormal results are displayed)  Labs Reviewed  COMPREHENSIVE METABOLIC PANEL - Abnormal; Notable for the following components:      Result Value   CO2 20 (*)    Glucose, Bld 157 (*)    Total Protein 8.7 (*)    Albumin 5.4 (*)    Total Bilirubin 1.9 (*)    All other components within normal limits  CBC - Abnormal; Notable for the following components:   WBC 15.7 (*)    RBC 6.16 (*)    Hemoglobin 18.4 (*)    All other components within normal limits  GASTROINTESTINAL PANEL BY PCR, STOOL (REPLACES STOOL CULTURE)  C DIFFICILE QUICK SCREEN W PCR REFLEX    LIPASE, BLOOD  MAGNESIUM  URINALYSIS, COMPLETE (UACMP) WITH MICROSCOPIC   ____________________________________________  EKG  ED ECG REPORT I, 10/22/20, the attending physician, personally viewed and interpreted this ECG.  Date: 08/22/2020 EKG Time: 23: 36 Rate: 120 Rhythm: Sinus tachycardia QRS Axis: normal Intervals: normal, QTc 463 ms ST/T Wave abnormalities: Non-specific ST segment / T-wave changes, but no clear evidence of acute  ischemia. Narrative Interpretation: no definitive evidence of acute ischemia; does not meet STEMI criteria.  ____________________________________________    PROCEDURES   Procedure(s) performed (including Critical Care):  .1-3 Lead EKG Interpretation  Date/Time: 08/23/2020 1:22 AM Performed by: 10/23/2020, MD Authorized by: Loleta Rose, MD     Interpretation: abnormal     ECG rate:  102   ECG rate assessment: tachycardic     Rhythm: sinus tachycardia     Ectopy: none     Conduction: normal     ____________________________________________   INITIAL IMPRESSION / MDM / ASSESSMENT AND PLAN / ED COURSE  As part of my medical decision making, I reviewed the following data within the electronic MEDICAL RECORD NUMBER Nursing notes reviewed and incorporated, Labs reviewed , EKG interpreted , Old chart reviewed, and Notes from prior ED visits   Differential diagnosis includes, but is not limited  to, viral gastroenteritis, bacterial gastroenteritis, nonspecific colitis or enteritis, acid reflux, medication or drug side effect, less likely biliary colic.  The patient is on the cardiac monitor to evaluate for evidence of arrhythmia and/or significant heart rate changes.  Patient is tachycardic and appears volume depleted.  He appears uncomfortable but nontoxic.  Mucous membranes are dry.  Comprehensive metabolic panel is notable for a very slightly decreased CO2 and an increased total bilirubin of 1.9 although this is of unclear clinical significance.  CBC is notable for a leukocytosis which could be inflammatory or infectious, and his hemoglobin is elevated at 18.4 which I strongly suspect is due to hemoconcentration from his volume depletion.  Lipase is normal.  Patient and his wife state that he has been having diarrhea every 15 minutes and that is very watery.  He has not been on antibiotics recently.      Clinical Course as of 08/23/20 0352  Sat Aug 23, 2020  0137 Magnesium: 2.0  [CF]  0348 Patient feels much better.  After more than 4 hours he was able to provide a stool specimen.  He still does not feel completely back to normal but the diarrhea has slowed down and he has not vomited since coming to the emergency department.  He continues to have no abdominal pain.  I do not think that he would benefit from imaging at this time.  He is comfortable with the plan for discharge, symptomatic management, and outpatient follow-up as needed.  I gave my usual and customary return precautions.  Of note, I let him know that he should check MyChart for the results of his stool studies. [CF]    Clinical Course User Index [CF] Loleta Rose, MD     ____________________________________________  FINAL CLINICAL IMPRESSION(S) / ED DIAGNOSES  Final diagnoses:  Gastroenteritis     MEDICATIONS GIVEN DURING THIS VISIT:  Medications  ondansetron (ZOFRAN) injection 4 mg (4 mg Intravenous Given 08/23/20 0001)  lactated ringers bolus 2,000 mL (0 mLs Intravenous Stopped 08/23/20 0336)  droperidol (INAPSINE) 2.5 MG/ML injection 1.25 mg (1.25 mg Intravenous Given 08/23/20 0130)     ED Discharge Orders          Ordered    ondansetron (ZOFRAN ODT) 4 MG disintegrating tablet        08/23/20 0350             Note:  This document was prepared using Dragon voice recognition software and may include unintentional dictation errors.   Loleta Rose, MD 08/23/20 [phone]

## 2020-08-23 NOTE — Discharge Instructions (Addendum)
We believe your symptoms are caused by either a viral infection or possibly a bad food exposure.  Either way, since your symptoms have improved, we feel it is safe for you to go home and follow up with your regular doctor.  Please read the included information and stick to a bland diet for the next two days.  Drink plenty of clear fluids, and if you were provided with a prescription, please take it according to the label instructions.    Please note that you can check MyChart later today for the results of your stool studies.  They should be ready at some point later today.  Even though they will not change the management (in general, neither bacterial nor viral stool infections are treated with antibiotics), the results may give you some reassurance or may give you an idea of what to expect (in the case of a diagnosis such as norovirus, for example).  If you develop any new or worsening symptoms, including persistent vomiting not controlled with medication, fever greater than 101, severe or worsening abdominal pain, or other symptoms that concern you, please return immediately to the Emergency Department.

## 2020-08-24 LAB — GASTROINTESTINAL PANEL BY PCR, STOOL (REPLACES STOOL CULTURE)

## 2020-09-17 DIAGNOSIS — Z20822 Contact with and (suspected) exposure to covid-19: Secondary | ICD-10-CM | POA: Diagnosis not present

## 2020-09-19 DIAGNOSIS — Z20822 Contact with and (suspected) exposure to covid-19: Secondary | ICD-10-CM | POA: Diagnosis not present

## 2021-01-14 DIAGNOSIS — R509 Fever, unspecified: Secondary | ICD-10-CM | POA: Diagnosis not present

## 2021-01-14 DIAGNOSIS — R059 Cough, unspecified: Secondary | ICD-10-CM | POA: Diagnosis not present

## 2021-01-14 DIAGNOSIS — J029 Acute pharyngitis, unspecified: Secondary | ICD-10-CM | POA: Diagnosis not present

## 2021-06-01 DIAGNOSIS — E7800 Pure hypercholesterolemia, unspecified: Secondary | ICD-10-CM | POA: Diagnosis not present

## 2021-06-01 DIAGNOSIS — Z0000 Encounter for general adult medical examination without abnormal findings: Secondary | ICD-10-CM | POA: Diagnosis not present

## 2021-06-01 DIAGNOSIS — R635 Abnormal weight gain: Secondary | ICD-10-CM | POA: Diagnosis not present

## 2021-12-24 IMAGING — CR DG CHEST 2V
2 series · 2 of 2 positions shown · non-contrast
Comparison: None.

CLINICAL DATA: Shortness of breath, potential aspiration

EXAM:
CHEST - 2 VIEW

[chest pa]
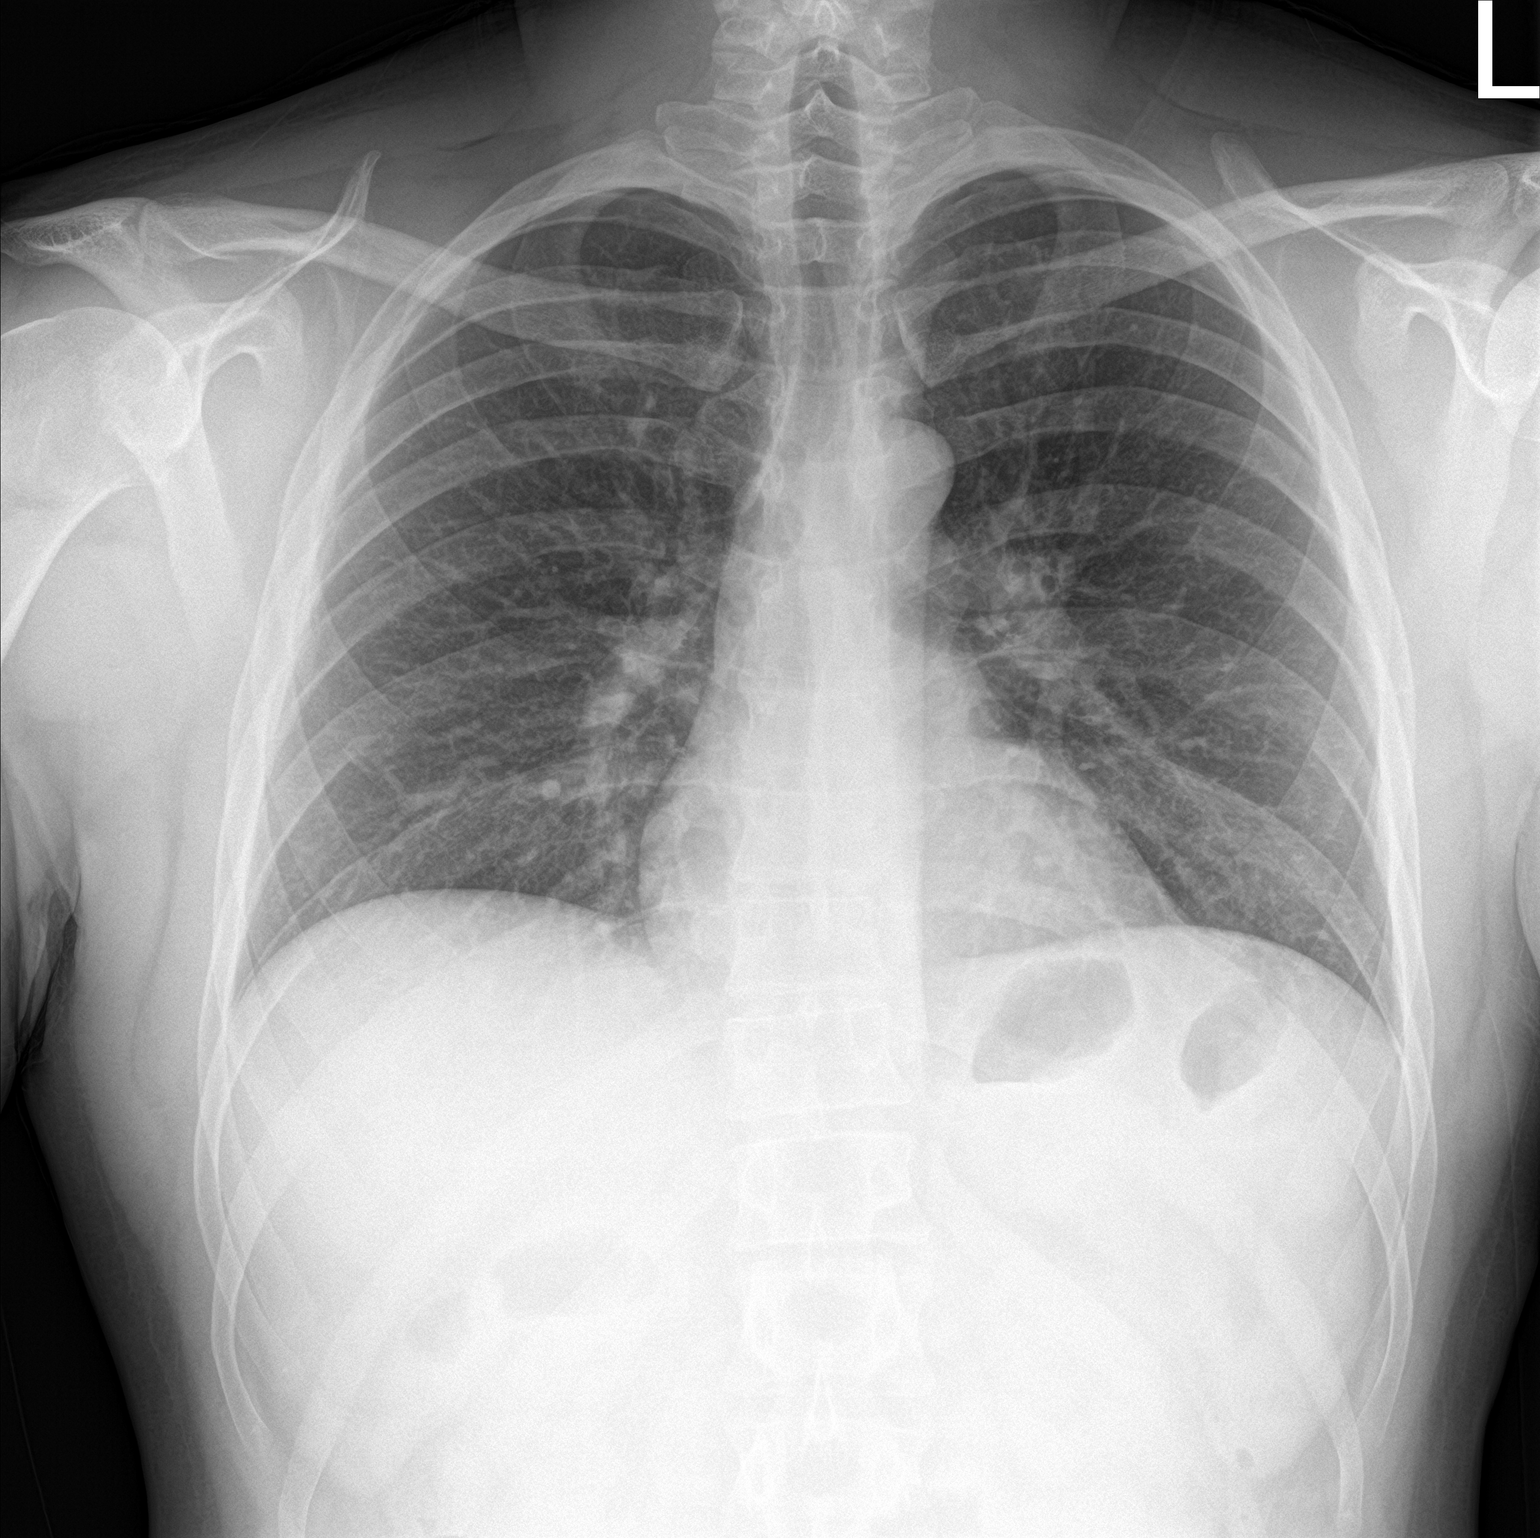

[chest lat]
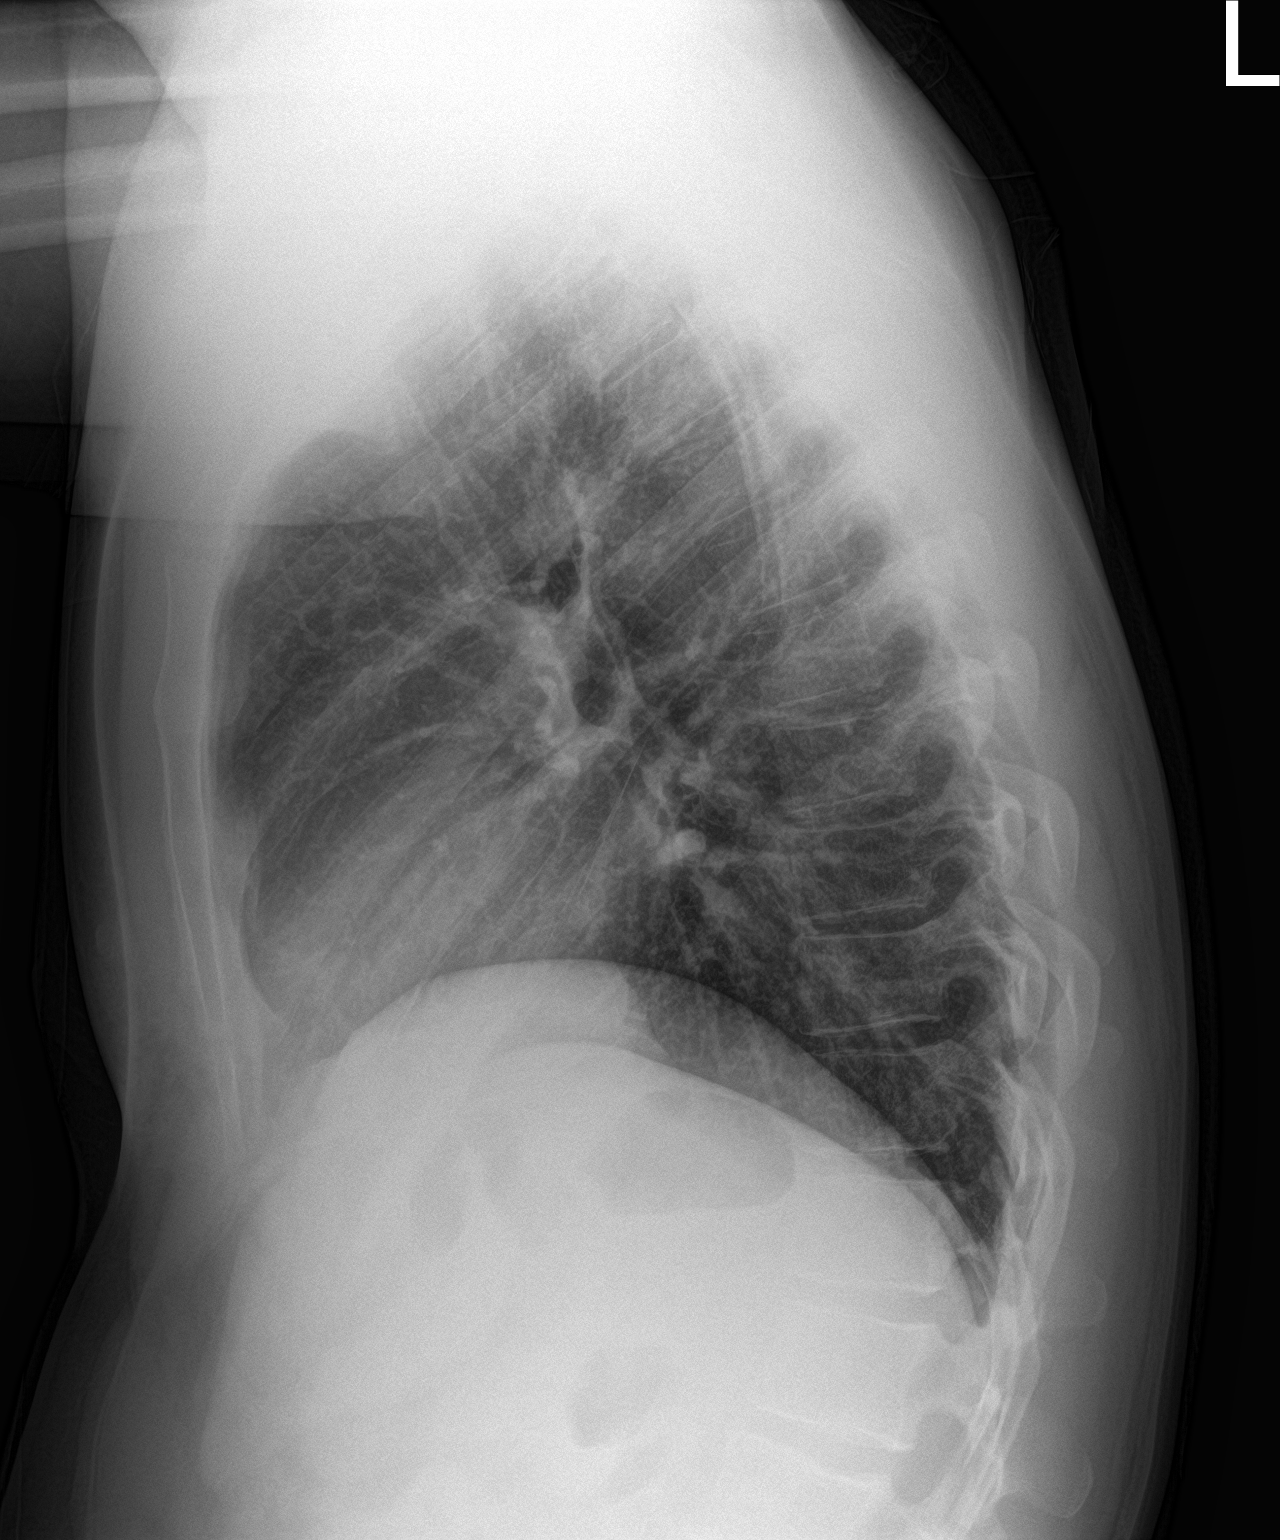

[2 of 2 positions shown; findings below may reference images not displayed]

FINDINGS: No consolidation, features of edema, pneumothorax, or effusion.
Pulmonary vascularity is normally distributed. The cardiomediastinal
contours are unremarkable. Slight exaggeration of the lower thoracic
kyphosis. No acute osseous or soft tissue abnormality.
IMPRESSION: No acute cardiopulmonary abnormality.

## 2021-12-25 IMAGING — DX DG CHEST 2V
2 series · 2 of 2 positions shown · non-contrast
Comparison: 03/04/2020

CLINICAL DATA: Aspiration

EXAM:
CHEST - 2 VIEW

[chest lat]
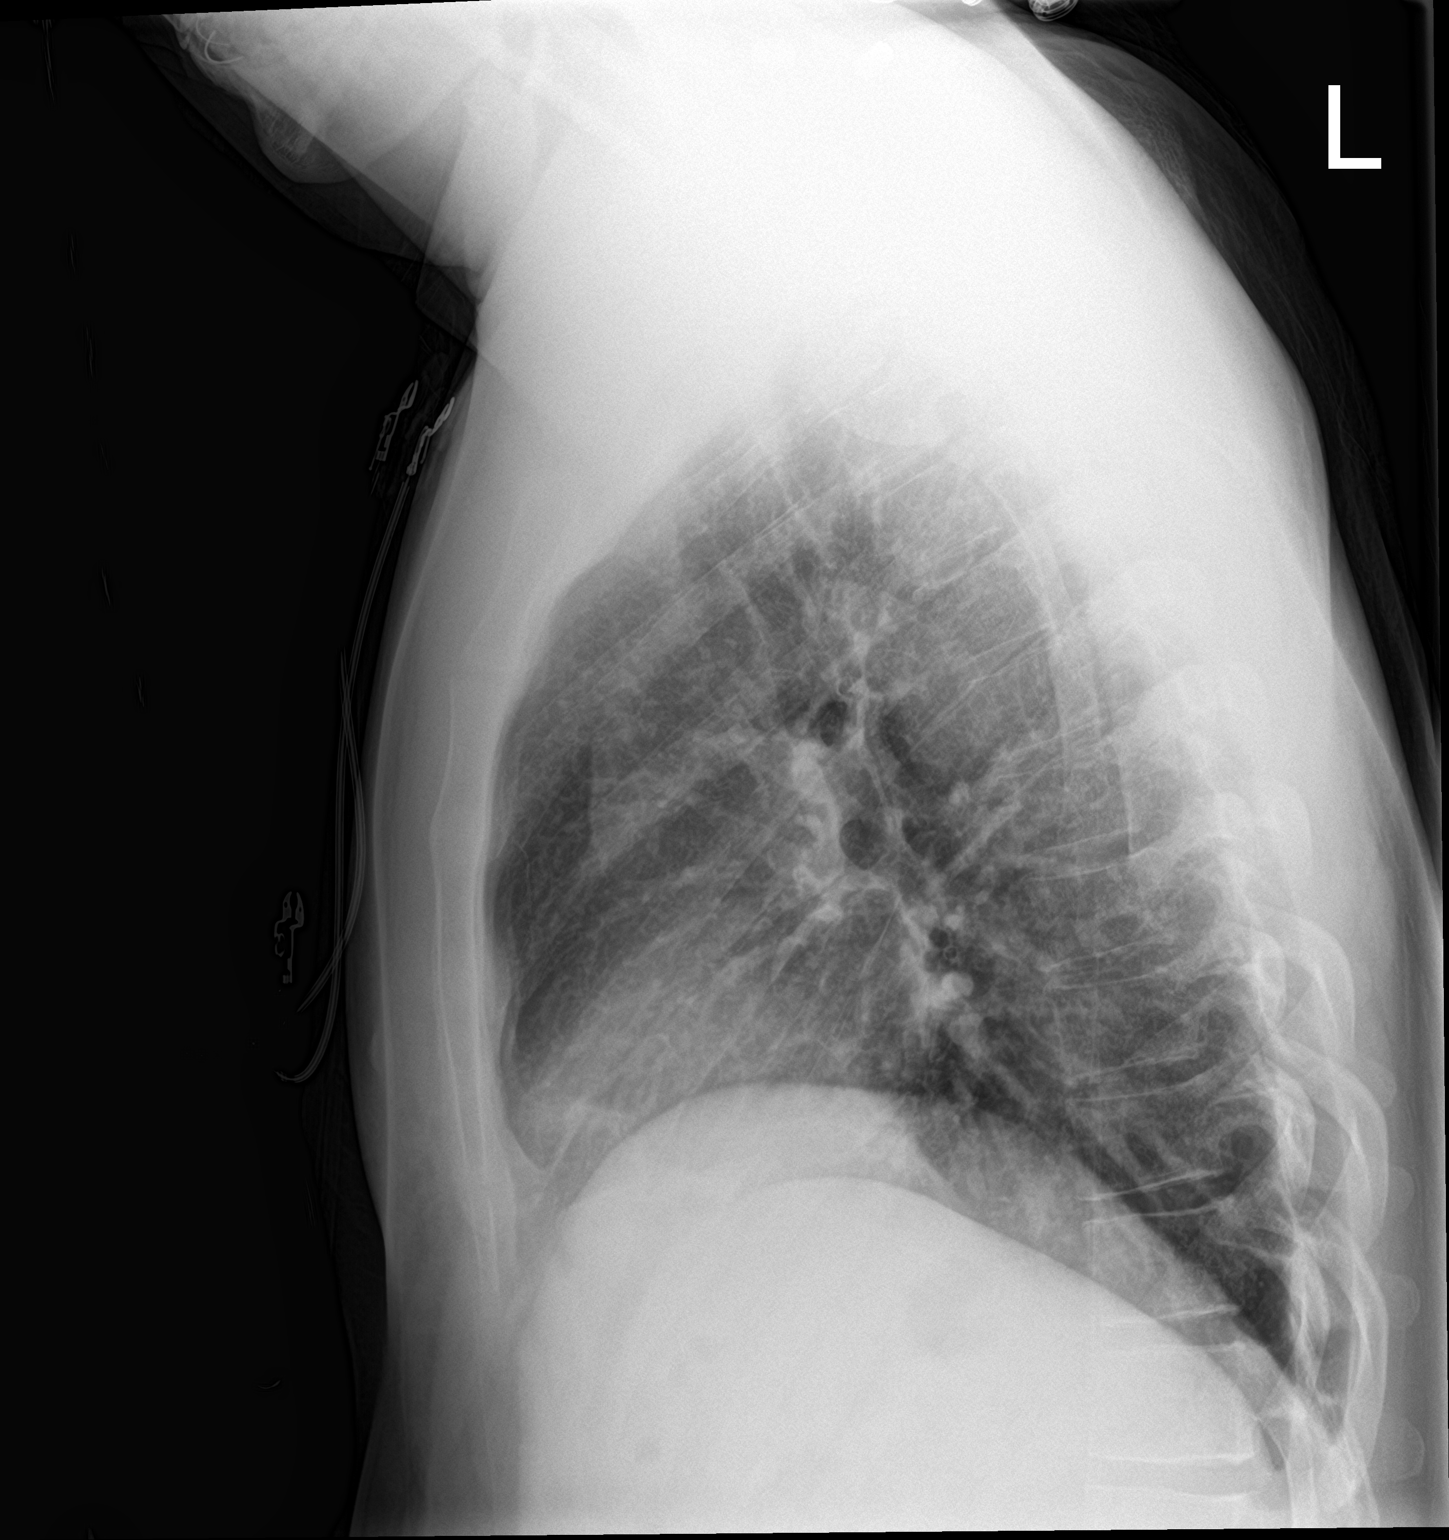

[chest pa]
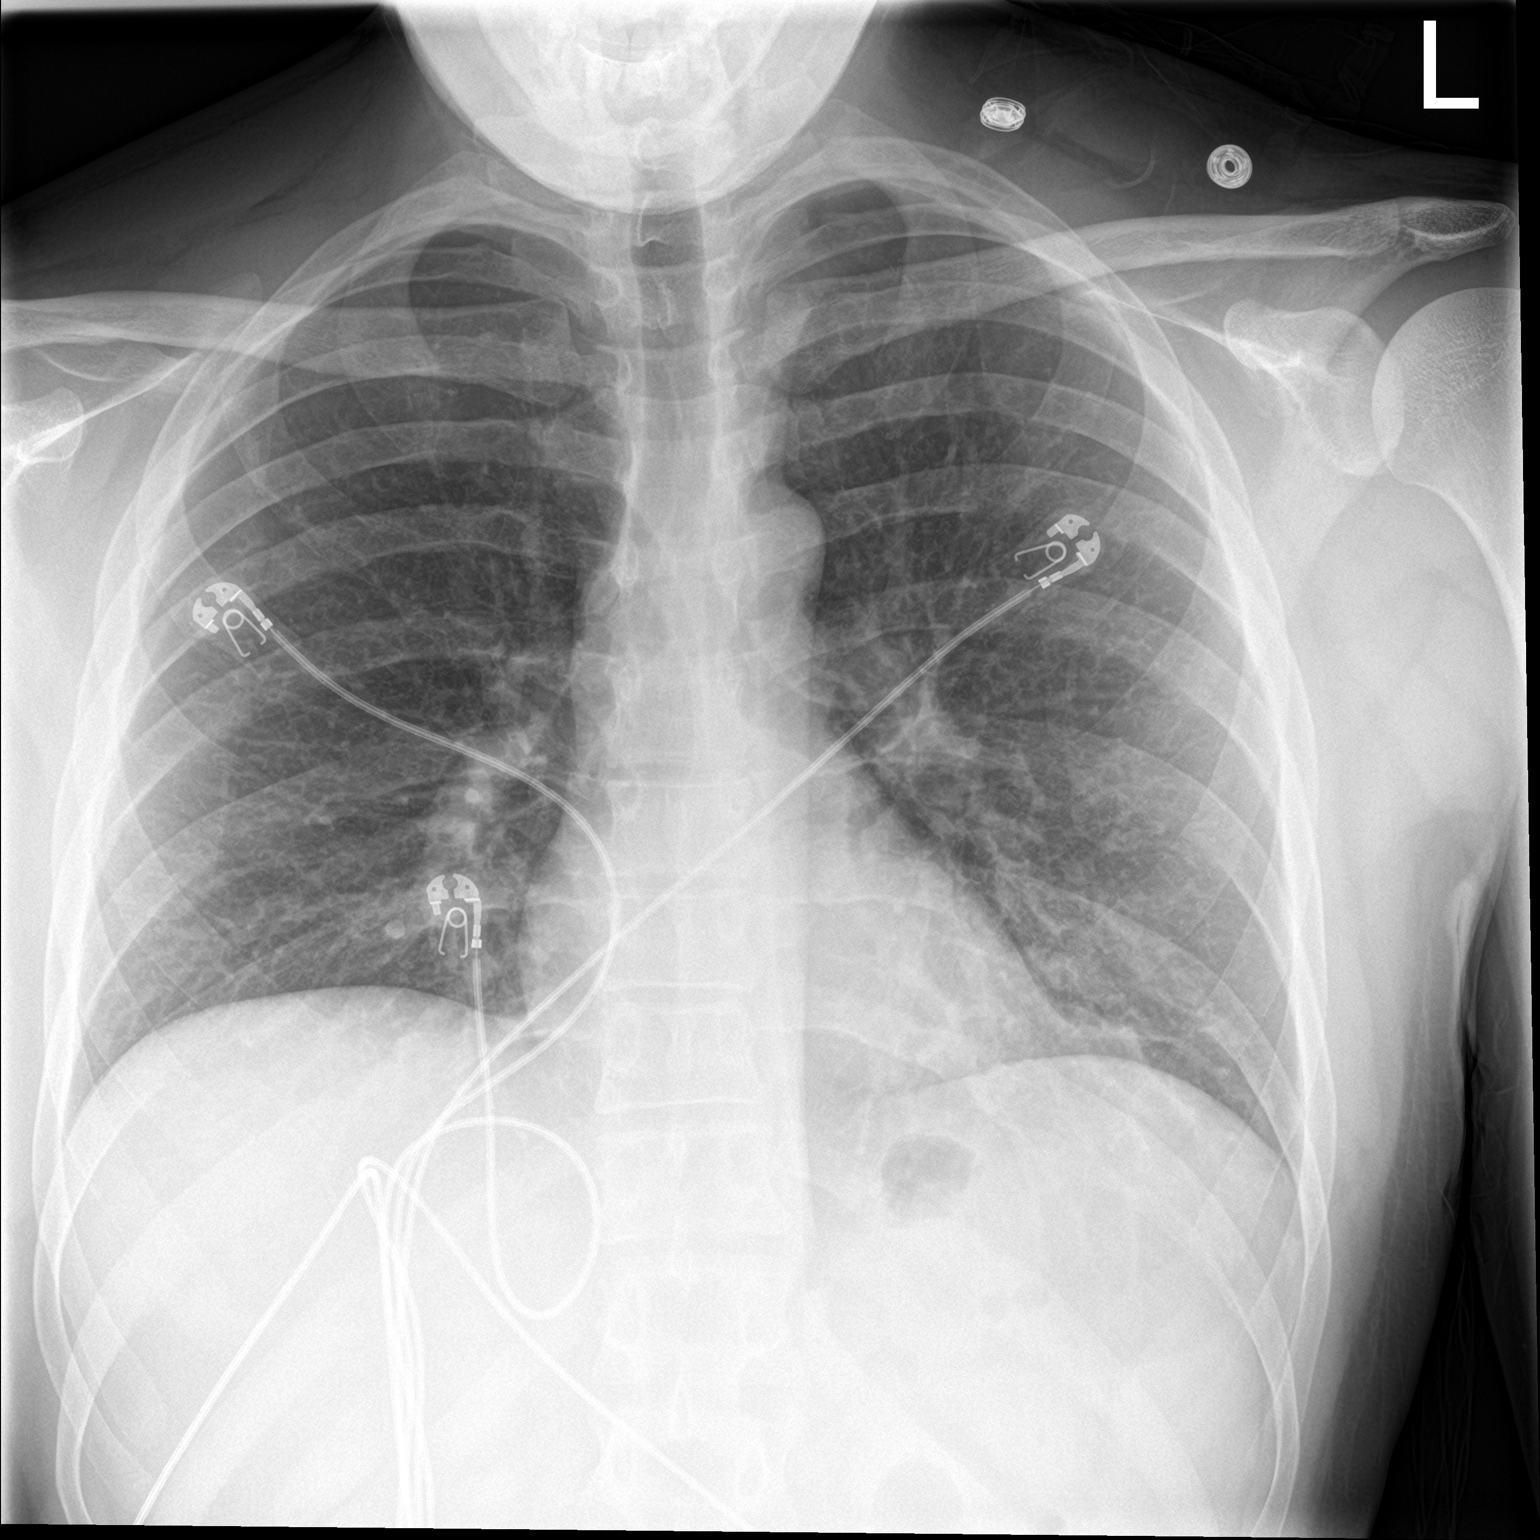

[2 of 2 positions shown; findings below may reference images not displayed]

FINDINGS: The heart size and mediastinal contours are within normal limits.
Slightly low lung volumes. Interval development of streaky left
basilar opacity. No pleural effusion or pneumothorax. The visualized
skeletal structures are unremarkable.
IMPRESSION: Interval development of streaky left basilar opacity, which may
reflect atelectasis versus developing infiltrate/aspiration.

## 2022-06-07 DIAGNOSIS — Z131 Encounter for screening for diabetes mellitus: Secondary | ICD-10-CM | POA: Diagnosis not present

## 2022-06-07 DIAGNOSIS — Z0000 Encounter for general adult medical examination without abnormal findings: Secondary | ICD-10-CM | POA: Diagnosis not present

## 2022-06-07 DIAGNOSIS — E7800 Pure hypercholesterolemia, unspecified: Secondary | ICD-10-CM | POA: Diagnosis not present

## 2022-11-24 ENCOUNTER — Emergency Department
Admission: EM | Admit: 2022-11-24 | Discharge: 2022-11-24 | Disposition: A | Discharge status: Home / Self Care | Payer: BC Managed Care – PPO | Attending: Emergency Medicine | Admitting: Emergency Medicine

## 2022-11-24 ENCOUNTER — Other Ambulatory Visit: Payer: Self-pay

## 2022-11-24 DIAGNOSIS — I10 Essential (primary) hypertension: Secondary | ICD-10-CM | POA: Diagnosis not present

## 2022-11-24 DIAGNOSIS — R109 Unspecified abdominal pain: Secondary | ICD-10-CM | POA: Insufficient documentation

## 2022-11-24 DIAGNOSIS — Y92410 Unspecified street and highway as the place of occurrence of the external cause: Secondary | ICD-10-CM | POA: Insufficient documentation

## 2022-11-24 DIAGNOSIS — R519 Headache, unspecified: Secondary | ICD-10-CM | POA: Diagnosis not present

## 2022-11-24 DIAGNOSIS — Z041 Encounter for examination and observation following transport accident: Secondary | ICD-10-CM | POA: Diagnosis not present

## 2022-11-24 NOTE — Discharge Instructions (Signed)
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
## 2023-02-28 ENCOUNTER — Ambulatory Visit
Admission: EM | Admit: 2023-02-28 | Discharge: 2023-02-28 | Disposition: A | Discharge status: Home / Self Care | Payer: BC Managed Care – PPO | Attending: Emergency Medicine | Admitting: Emergency Medicine

## 2023-02-28 DIAGNOSIS — J069 Acute upper respiratory infection, unspecified: Secondary | ICD-10-CM

## 2023-02-28 LAB — POC COVID19/FLU A&B COMBO
Covid Antigen, POC: NEGATIVE
Influenza A Antigen, POC: NEGATIVE
Influenza B Antigen, POC: NEGATIVE

## 2023-02-28 NOTE — ED Provider Notes (Signed)
Renaldo Fiddler    CSN: [id number] Arrival date & time: 02/28/23  0865      History   Chief Complaint Chief Complaint  Patient presents with   URI    HPI Anthony Robles is a 32 y.o. male.   Patient presents for evaluation of a nonproductive cough, nasal congestion, rhinorrhea and postnasal drip present for 2 days.  Associated nausea without vomiting but able to tolerate food and liquids.  Has attempted use of Alka-Seltzer day, unsure if effective.  No known sick contacts.  Requesting COVID and flu testing as his wife is currently in labor.  Denies presence of fever.  Past Medical History:  Diagnosis Date   Appendicitis    COVID-19     Patient Active Problem List   Diagnosis Date Noted   Aspiration pneumonia (HCC) 03/05/2020   Acute appendicitis 03/23/2014   Appendicitis 03/23/2014    Past Surgical History:  Procedure Laterality Date   APPENDECTOMY     LAPAROSCOPIC APPENDECTOMY N/A 03/23/2014   Procedure: APPENDECTOMY LAPAROSCOPIC;  Surgeon: Manus Rudd, MD;  Location: [facility] OR;  Service: General;  Laterality: N/A;   TYMPANOSTOMY TUBE PLACEMENT     1.32 y/o    WISDOM TOOTH EXTRACTION     32 y/o       Home Medications    Prior to Admission medications   Medication Sig Start Date End Date Taking? Authorizing Provider  ondansetron (ZOFRAN ODT) 4 MG disintegrating tablet Allow 1-2 tablets to dissolve in your mouth every 8 hours as needed for nausea/vomiting Patient not taking: Reported on 02/28/2023 08/23/20   Loleta Rose, MD  dicyclomine (BENTYL) 20 MG tablet Take 1 tablet (20 mg total) by mouth 2 (two) times daily as needed for spasms (diarrhea and abdominal cramping). Patient not taking: No sig reported 01/23/16 03/11/20  Street, [place], PA-C    Family History Family History  Problem Relation Age of Onset   Asthma Mother    Heart failure Father    Immunocompromised Neg Hx     Social History Social History   Tobacco Use   Smoking status: Never    Smokeless tobacco: Never  Substance Use Topics   Alcohol use: Yes    Comment: occ   Drug use: No     Allergies   Patient has no known allergies.   Review of Systems Review of Systems   Physical Exam Triage Vital Signs ED Triage Vitals  Encounter Vitals Group     BP 02/28/23 0925 118/68     Systolic BP Percentile --      Diastolic BP Percentile --      Pulse Rate 02/28/23 0925 (!) 122     Resp 02/28/23 0925 19     Temp 02/28/23 0925 99.5 F (37.5 C)     Temp Source 02/28/23 0925 Oral     SpO2 02/28/23 0925 95 %     Weight --      Height --      Head Circumference --      Peak Flow --      Pain Score 02/28/23 0903 0     Pain Loc --      Pain Education --      Exclude from Growth Chart --    No data found.  Updated Vital Signs BP 118/68 (BP Location: Left Arm)   Pulse (!) 122   Temp 99.5 F (37.5 C) (Oral)   Resp 19   SpO2 95%   Visual Acuity Right Eye  Distance:   Left Eye Distance:   Bilateral Distance:    Right Eye Near:   Left Eye Near:    Bilateral Near:     Physical Exam Constitutional:      Appearance: Normal appearance.  HENT:     Head: Normocephalic.     Right Ear: Tympanic membrane, ear canal and external ear normal.     Left Ear: Tympanic membrane, ear canal and external ear normal.     Nose: Congestion present. No rhinorrhea.     Mouth/Throat:     Mouth: Mucous membranes are moist.     Pharynx: Oropharynx is clear. No oropharyngeal exudate or posterior oropharyngeal erythema.  Eyes:     Extraocular Movements: Extraocular movements intact.  Cardiovascular:     Rate and Rhythm: Normal rate and regular rhythm.     Pulses: Normal pulses.     Heart sounds: Normal heart sounds.  Pulmonary:     Effort: Pulmonary effort is normal.     Breath sounds: Normal breath sounds.  Musculoskeletal:     Cervical back: Normal range of motion and neck supple.  Neurological:     Mental Status: He is alert and oriented to person, place, and time.  Mental status is at baseline.      UC Treatments / Results  Labs (all labs ordered are listed, but only abnormal results are displayed) Labs Reviewed  POC COVID19/FLU A&B COMBO    EKG   Radiology No results found.  Procedures Procedures (including critical care time)  Medications Ordered in UC Medications - No data to display  Initial Impression / Assessment and Plan / UC Course  I have reviewed the triage vital signs and the nursing notes.  Pertinent labs & imaging results that were available during my care of the patient were reviewed by me and considered in my medical decision making (see chart for details).  Viral URI with cough  Patient is in no signs of distress nor toxic appearing.  Vital signs are stable.  Low suspicion for pneumonia, pneumothorax or bronchitis and therefore will defer imaging.  COVID and flu negative.May use additional over-the-counter medications as needed for supportive care.  May follow-up with urgent care as needed if symptoms persist or worsen.   Final Clinical Impressions(s) / UC Diagnoses   Final diagnoses:  Viral URI with cough     Discharge Instructions      Your symptoms today are most likely being caused by a virus and should steadily improve in time it can take up to 7 to 10 days before you truly start to see a turnaround however things will get better    Covid and Flu test negative  You can take Tylenol and/or Ibuprofen as needed for fever reduction and pain relief.   For cough: honey 1/2 to 1 teaspoon (you can dilute the honey in water or another fluid).  You can also use guaifenesin and dextromethorphan for cough. You can use a humidifier for chest congestion and cough.  If you don't have a humidifier, you can sit in the bathroom with the hot shower running.      For sore throat: try warm salt water gargles, cepacol lozenges, throat spray, warm tea or water with lemon/honey, popsicles or ice, or OTC cold relief medicine for  throat discomfort.   For congestion: take a daily anti-histamine like Zyrtec, Claritin, and a oral decongestant, such as pseudoephedrine.  You can also use Flonase 1-2 sprays in each nostril daily.   It  is important to stay hydrated: drink plenty of fluids (water, gatorade/powerade/pedialyte, juices, or teas) to keep your throat moisturized and help further relieve irritation/discomfort.    ED Prescriptions   None    PDMP not reviewed this encounter.   Valinda Hoar, NP 02/28/23 [phone]

## 2023-02-28 NOTE — ED Triage Notes (Signed)
Patient to Urgent Care with complaints of dry cough/ nasal drainage/ post-nasal drip/chills/ upset stomach.  Reports started feeling badly approx two days ago.  Denies any known fevers.  Meds: alker-seltzer cold and flu.

## 2023-02-28 NOTE — Discharge Instructions (Addendum)
Your symptoms today are most likely being caused by a virus and should steadily improve in time it can take up to 7 to 10 days before you truly start to see a turnaround however things will get better    Covid and Flu test negative  You can take Tylenol and/or Ibuprofen as needed for fever reduction and pain relief.   For cough: honey 1/2 to 1 teaspoon (you can dilute the honey in water or another fluid).  You can also use guaifenesin and dextromethorphan for cough. You can use a humidifier for chest congestion and cough.  If you don't have a humidifier, you can sit in the bathroom with the hot shower running.      For sore throat: try warm salt water gargles, cepacol lozenges, throat spray, warm tea or water with lemon/honey, popsicles or ice, or OTC cold relief medicine for throat discomfort.   For congestion: take a daily anti-histamine like Zyrtec, Claritin, and a oral decongestant, such as pseudoephedrine.  You can also use Flonase 1-2 sprays in each nostril daily.   It is important to stay hydrated: drink plenty of fluids (water, gatorade/powerade/pedialyte, juices, or teas) to keep your throat moisturized and help further relieve irritation/discomfort.

## 2023-06-09 DIAGNOSIS — Z131 Encounter for screening for diabetes mellitus: Secondary | ICD-10-CM | POA: Diagnosis not present

## 2023-06-09 DIAGNOSIS — Z0000 Encounter for general adult medical examination without abnormal findings: Secondary | ICD-10-CM | POA: Diagnosis not present

## 2023-06-09 DIAGNOSIS — E669 Obesity, unspecified: Secondary | ICD-10-CM | POA: Diagnosis not present

## 2023-06-09 DIAGNOSIS — E782 Mixed hyperlipidemia: Secondary | ICD-10-CM | POA: Diagnosis not present

## 2023-08-01 DIAGNOSIS — R590 Localized enlarged lymph nodes: Secondary | ICD-10-CM | POA: Diagnosis not present

## 2023-08-11 ENCOUNTER — Other Ambulatory Visit: Payer: Self-pay | Admitting: Family Medicine

## 2023-08-11 DIAGNOSIS — R591 Generalized enlarged lymph nodes: Secondary | ICD-10-CM

## 2023-08-17 ENCOUNTER — Ambulatory Visit
Admission: RE | Admit: 2023-08-17 | Discharge: 2023-08-17 | Disposition: A | Discharge status: Home / Self Care | Source: Ambulatory Visit | Attending: Family Medicine | Admitting: Family Medicine

## 2023-08-17 DIAGNOSIS — R591 Generalized enlarged lymph nodes: Secondary | ICD-10-CM

## 2023-08-17 DIAGNOSIS — R221 Localized swelling, mass and lump, neck: Secondary | ICD-10-CM | POA: Diagnosis not present

## 2023-08-17 MED ORDER — IOPAMIDOL (ISOVUE-300) INJECTION 61%
75.0000 mL | Freq: Once | INTRAVENOUS | Status: AC | PRN
  Administered 2023-08-17: 75 mL via INTRAVENOUS

## 2023-09-23 DIAGNOSIS — J328 Other chronic sinusitis: Secondary | ICD-10-CM | POA: Diagnosis not present

## 2023-09-23 DIAGNOSIS — D170 Benign lipomatous neoplasm of skin and subcutaneous tissue of head, face and neck: Secondary | ICD-10-CM | POA: Diagnosis not present

## 2023-09-23 DIAGNOSIS — J339 Nasal polyp, unspecified: Secondary | ICD-10-CM | POA: Diagnosis not present

## 2023-10-13 DIAGNOSIS — D170 Benign lipomatous neoplasm of skin and subcutaneous tissue of head, face and neck: Secondary | ICD-10-CM | POA: Diagnosis not present

## 2023-10-13 DIAGNOSIS — J339 Nasal polyp, unspecified: Secondary | ICD-10-CM | POA: Diagnosis not present

## 2023-10-13 DIAGNOSIS — J329 Chronic sinusitis, unspecified: Secondary | ICD-10-CM | POA: Diagnosis not present

## 2023-10-26 DIAGNOSIS — J328 Other chronic sinusitis: Secondary | ICD-10-CM | POA: Diagnosis not present

## 2023-10-26 DIAGNOSIS — J339 Nasal polyp, unspecified: Secondary | ICD-10-CM | POA: Diagnosis not present

## 2023-10-26 DIAGNOSIS — J342 Deviated nasal septum: Secondary | ICD-10-CM | POA: Diagnosis not present
